# Patient Record
Sex: Male | Born: 1955
Health system: Southern US, Community
[De-identification: ages and names within clinical notes are randomized; demographics above are authoritative.]

## PROBLEM LIST (undated history)

## (undated) DIAGNOSIS — J449 Chronic obstructive pulmonary disease, unspecified: Secondary | ICD-10-CM

## (undated) DIAGNOSIS — M1990 Unspecified osteoarthritis, unspecified site: Secondary | ICD-10-CM

## (undated) DIAGNOSIS — I2510 Atherosclerotic heart disease of native coronary artery without angina pectoris: Secondary | ICD-10-CM

## (undated) HISTORY — PX: TONSILLECTOMY: SUR1361

## (undated) HISTORY — DX: Atherosclerotic heart disease of native coronary artery without angina pectoris: I25.10

## (undated) HISTORY — DX: Chronic obstructive pulmonary disease, unspecified: J44.9

## (undated) HISTORY — PX: APPENDECTOMY: SHX54

## (undated) HISTORY — PX: RECTOPERITONEAL FISTULA CLOSURE: SHX2314

## (undated) HISTORY — DX: Unspecified osteoarthritis, unspecified site: M19.90

## (undated) HISTORY — PX: KNEE ARTHROSCOPY: SUR90

## (undated) HISTORY — PX: LUMBAR SPINE SURGERY: SHX701

---

## 2011-04-21 ENCOUNTER — Ambulatory Visit: Payer: Self-pay | Admitting: Family Medicine

## 2011-08-21 ENCOUNTER — Other Ambulatory Visit: Payer: Self-pay | Admitting: Unknown Physician Specialty

## 2011-12-15 ENCOUNTER — Ambulatory Visit: Payer: Self-pay | Admitting: Unknown Physician Specialty

## 2012-02-25 ENCOUNTER — Ambulatory Visit: Payer: Self-pay | Admitting: Unknown Physician Specialty

## 2012-02-26 LAB — PATHOLOGY REPORT

## 2013-03-25 ENCOUNTER — Ambulatory Visit: Payer: Self-pay | Admitting: Unknown Physician Specialty

## 2013-04-14 ENCOUNTER — Other Ambulatory Visit: Payer: Self-pay | Admitting: Neurosurgery

## 2013-04-14 DIAGNOSIS — M549 Dorsalgia, unspecified: Secondary | ICD-10-CM

## 2013-04-22 ENCOUNTER — Ambulatory Visit
Admission: RE | Admit: 2013-04-22 | Discharge: 2013-04-22 | Disposition: A | Discharge status: Home / Self Care | Payer: BC Managed Care – PPO | Source: Ambulatory Visit | Attending: Neurosurgery | Admitting: Neurosurgery

## 2013-04-22 VITALS — BP 119/69 | HR 67

## 2013-04-22 DIAGNOSIS — M549 Dorsalgia, unspecified: Secondary | ICD-10-CM

## 2013-04-22 MED ORDER — ONDANSETRON HCL 4 MG/2ML IJ SOLN
4.0000 mg | Freq: Once | INTRAMUSCULAR | Status: AC
  Administered 2013-04-22: 4 mg via INTRAMUSCULAR

## 2013-04-22 MED ORDER — DIAZEPAM 5 MG PO TABS
10.0000 mg | ORAL_TABLET | Freq: Once | ORAL | Status: AC
  Administered 2013-04-22: 10 mg via ORAL

## 2013-04-22 MED ORDER — IOHEXOL 180 MG/ML  SOLN
20.0000 mL | Freq: Once | INTRAMUSCULAR | Status: AC | PRN
  Administered 2013-04-22: 20 mL via INTRATHECAL

## 2013-04-22 MED ORDER — MEPERIDINE HCL 100 MG/ML IJ SOLN
100.0000 mg | Freq: Once | INTRAMUSCULAR | Status: AC
  Administered 2013-04-22: 100 mg via INTRAMUSCULAR

## 2014-05-18 IMAGING — CT CT L SPINE W/ CM
4 of 11 series · 13 of 33 positions shown, 15 images · non-contrast
Comparison: 03/25/2013 MRI

CLINICAL DATA: The left leg pain.  Back pain.

CT MYELOGRAPHY LUMBAR SPINE
TECHNIQUE: CT imaging of the lumbar spine was performed after
intrathecal contrast administration.  Multiplanar CT image
reconstructions were also generated.

[Series 2: l spine bone · axial · 0.27mm/px · z∈[-314,-57]mm · 3 of 104 slices shown, 4 images]
[im 1/104  soft-tissue]
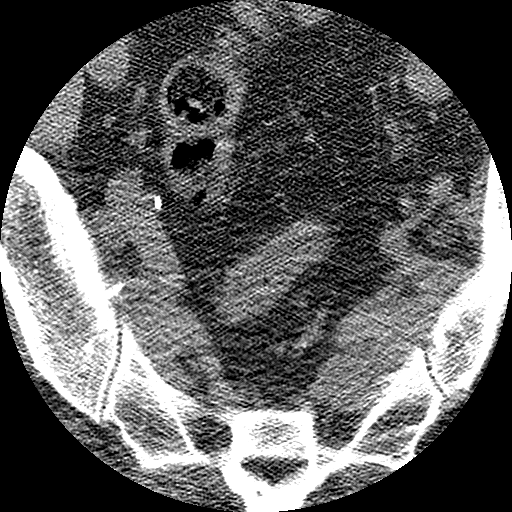
[im 1/104  bone]
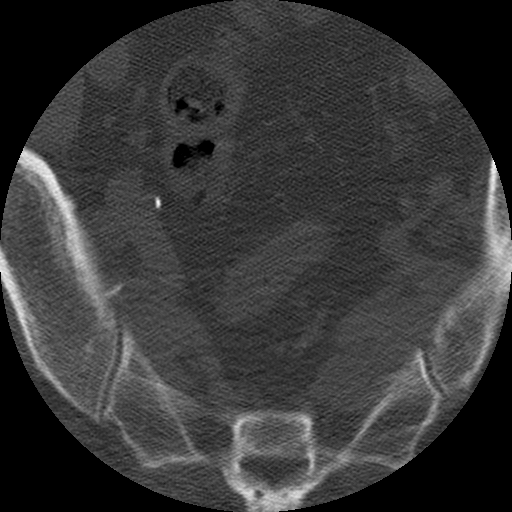
[im 52/104  bone]
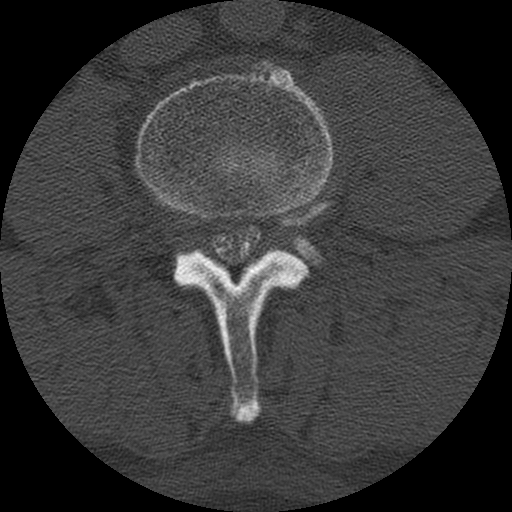
[im 104/104  bone]
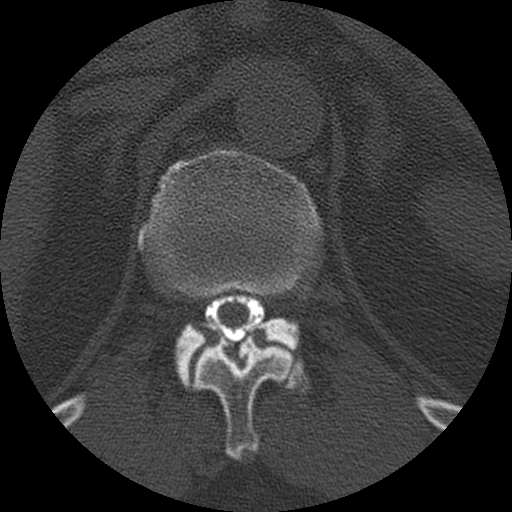

[Series 3: l spine soft · axial · 0.27mm/px · z∈[-230,-144]mm · 2 of 104 slices shown]
[im 35/104  soft-tissue]
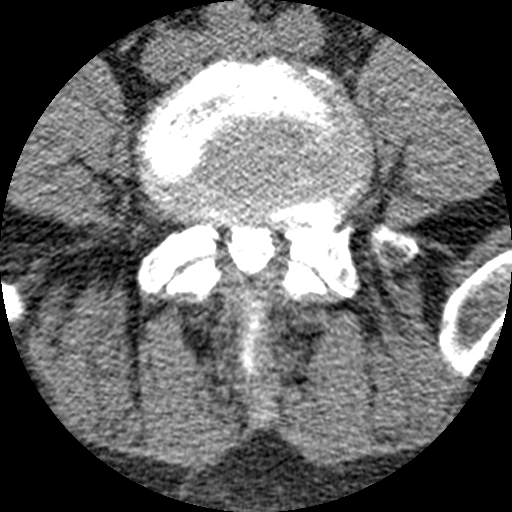
[im 69/104  soft-tissue]
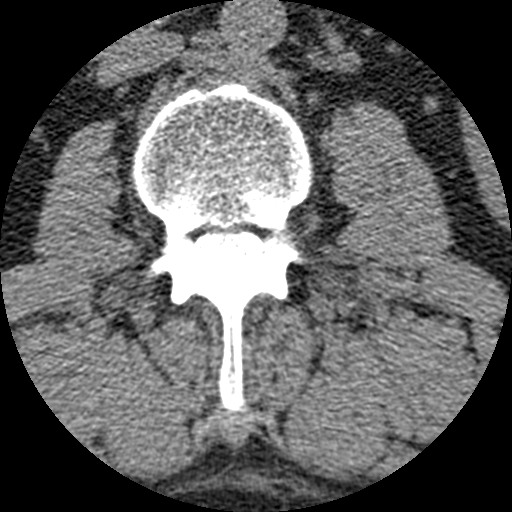

[Series 401: cor lower · coronal · 0.52mm/px · 3 of 69 slices shown]
[im 7/69  bone]
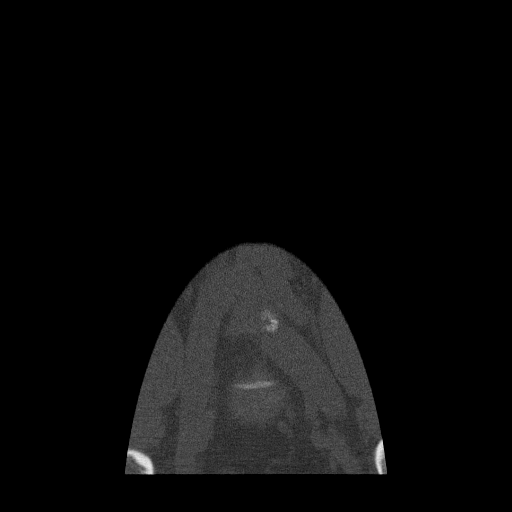
[im 35/69  bone]
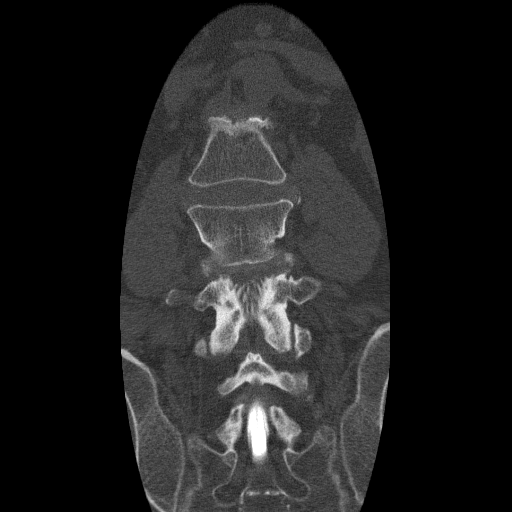
[im 63/69  bone]
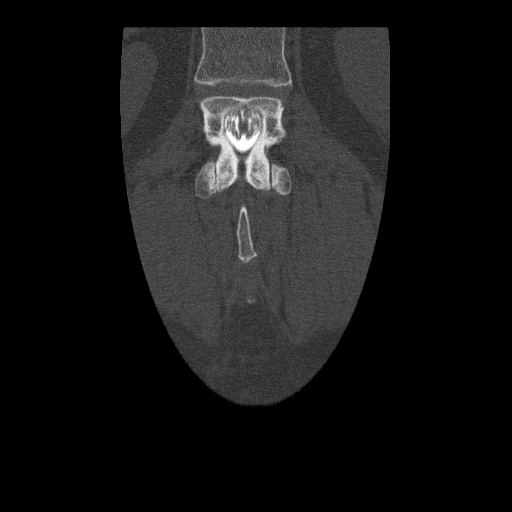

[Series 402: sag · sagittal · 0.52mm/px · 5 of 56 slices shown, 6 images]
[im 19/56  bone]
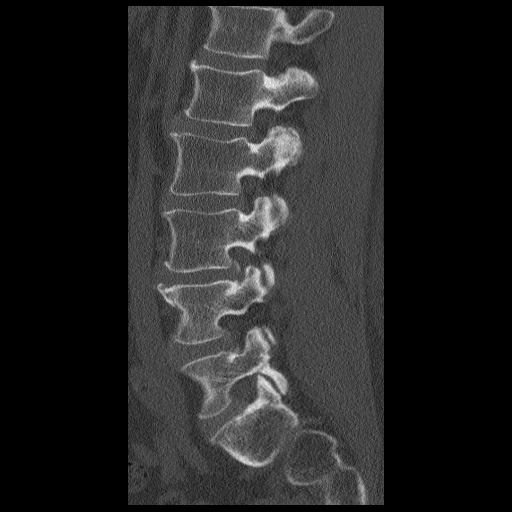
[im 23/56  bone]
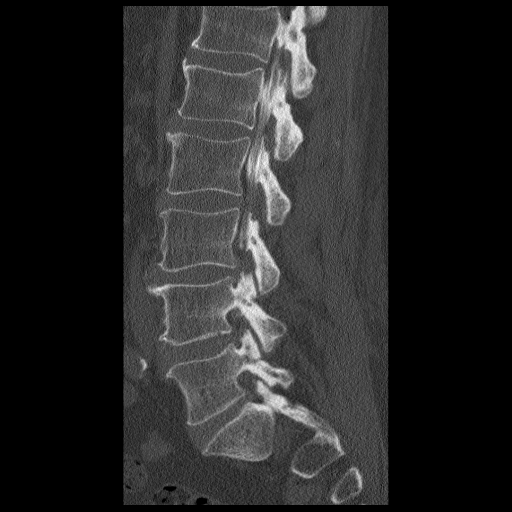
[im 28/56  soft-tissue]
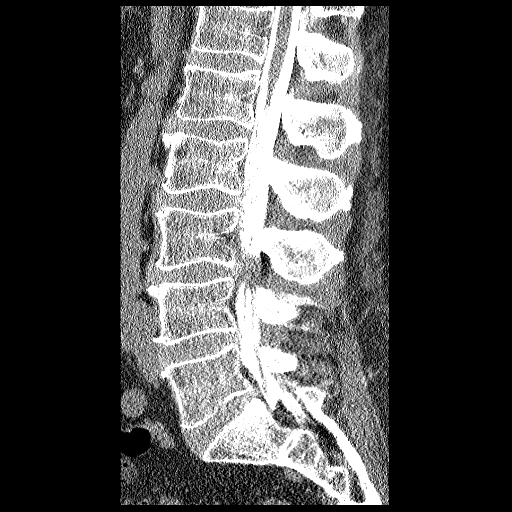
[im 28/56  bone]
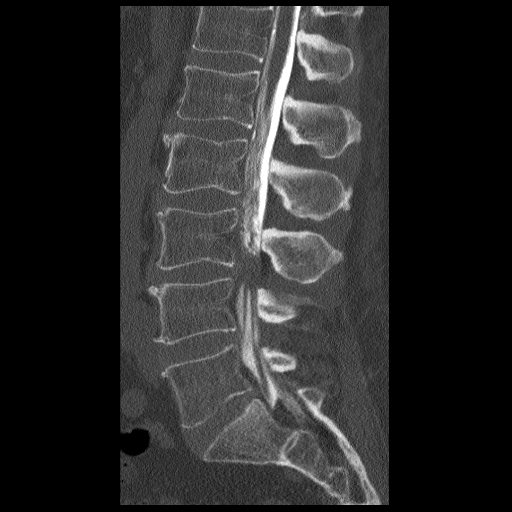
[im 33/56  bone]
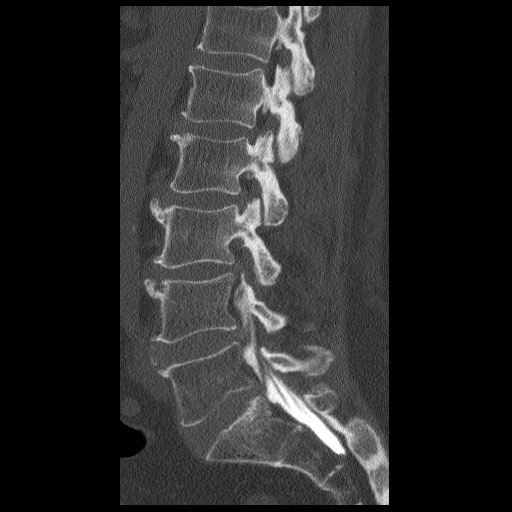
[im 37/56  bone]
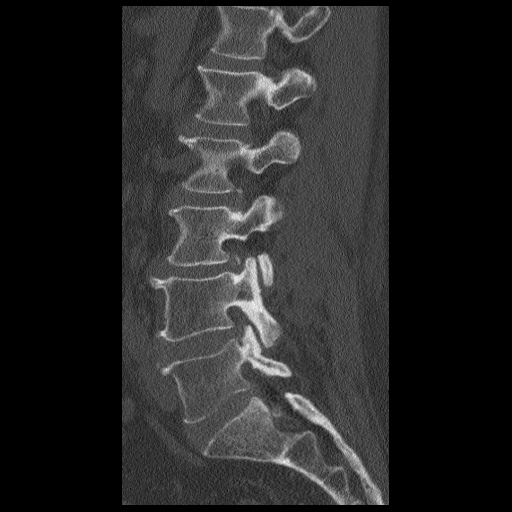

[13 of 33 positions shown; findings below may reference images not displayed]

FINDINGS: Radiologist injection.

Conus medullaris terminates at the L1-2 disc.  No vertebral
compression.  Anatomic alignment.  No pars defect.

L1-2:  Mild disc bulge laterally and anteriorly without central
canal encroachment.  Mild ligamentum flavum hypertrophy.  Patent
foramina.

L2-3:  Mild concentric bulge. Minimal left foraminal and
extraforaminal protrusion causing mild left foraminal narrowing..

L3-4:  Concentric bulge and ligamentum flavum hypertrophy result in
severe central stenosis and impingement of the nerve roots.  There
is redundancy of the nerve roots above this level with
straightening of the nerve roots below the disc.  Moderate
bilateral foraminal narrowing secondary to disc material.

L4-5:  Minimal concentric disc bulge without stenosis.  Mild
ligamentum flavum hypertrophy.  Mild bilateral foraminal narrowing
is multifactorial.

L5-S1:  Unremarkable level.
IMPRESSION: Severe multifactorial spinal stenosis at L3-4 with redundancy of
the nerve roots above the level.  There is foraminal narrowing at
this level as well.

No other significant focal area of impingement.  Mild degenerative
changes are noted.

## 2014-05-18 IMAGING — RF DG MYELOGRAM LUMBAR
14 series · 14 of 14 positions shown · IV contrast (omnipaque)
Comparison: none

CLINICAL DATA: Left lower extremity pain.  Back pain.

MYELOGRAM LUMBAR
TECHNIQUE: The procedure, risks, benefits, and alternatives were
explained to the patient. The patient understands and consents.
Under fluoroscopic guidance, a 22 gauge spinal needle was placed in
the CSF space via right L5-S1 approach. 20 mL of Omnipaque 180 was
injected.

[Series 1: (hospital) · 1 of 1 slices shown]
[im 1/1]
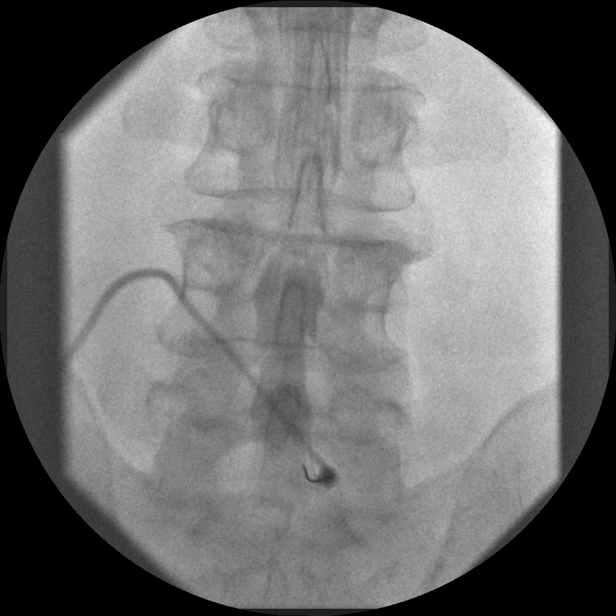

[Series 2: myelogram  white · 1 of 1 slices shown (1 of 13)]
[im 1/1]
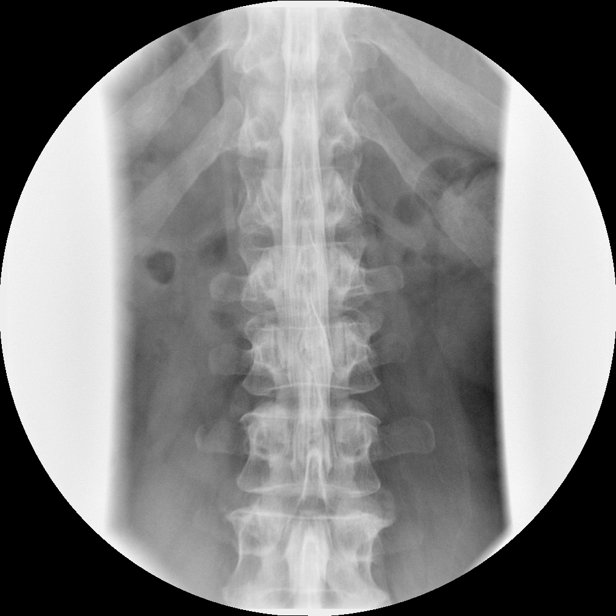

[Series 3: myelogram  white · 1 of 1 slices shown (2 of 13)]
[im 1/1]
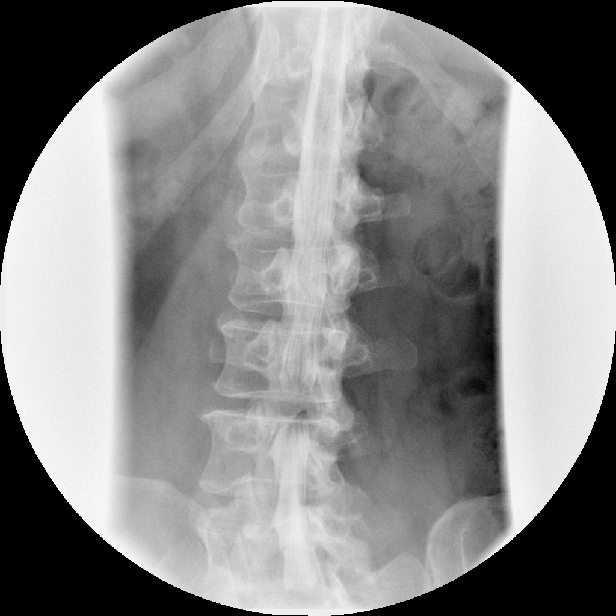

[Series 4: myelogram  white · 1 of 1 slices shown (3 of 13)]
[im 1/1]
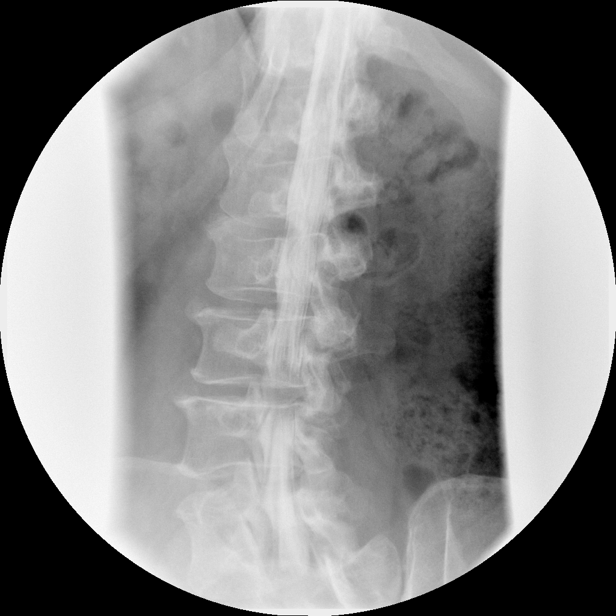

[Series 5: myelogram  white · 1 of 1 slices shown (4 of 13)]
[im 1/1]
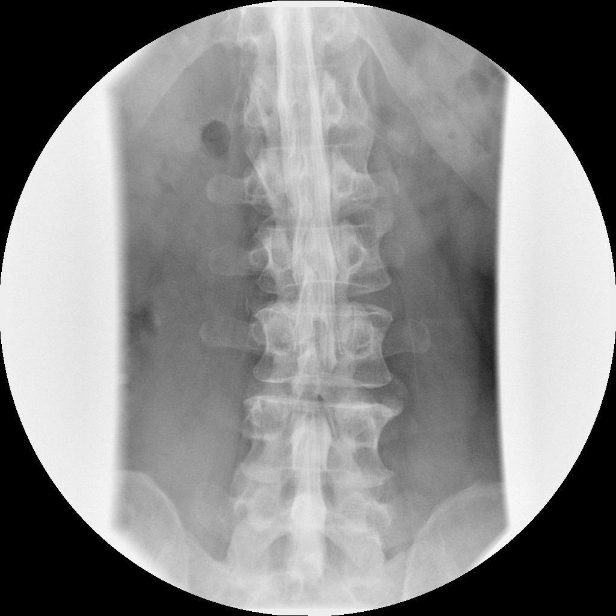

[Series 6: myelogram  white · 1 of 1 slices shown (5 of 13)]
[im 1/1]
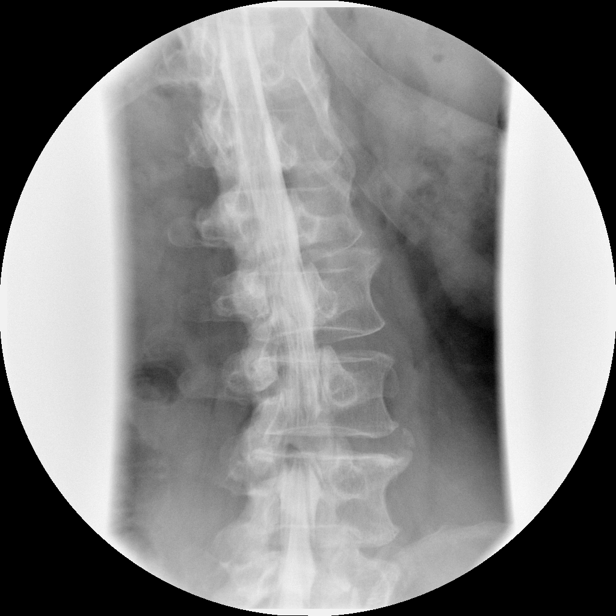

[Series 7: myelogram  white · 1 of 1 slices shown (6 of 13)]
[im 1/1]
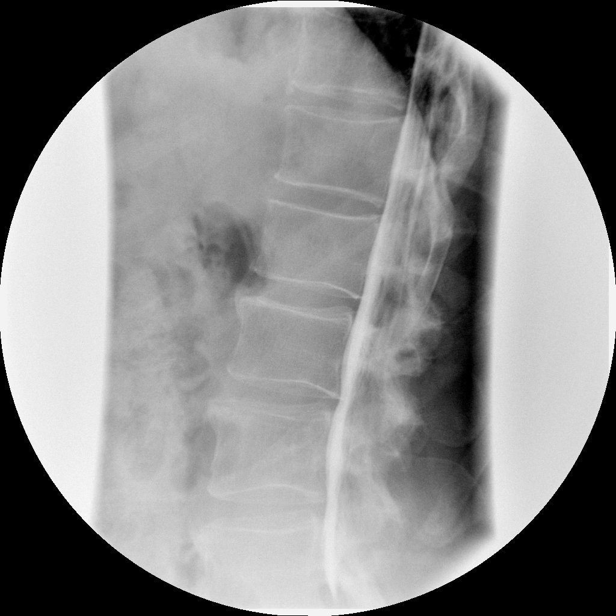

[Series 8: myelogram  white · 1 of 1 slices shown (7 of 13)]
[im 1/1]
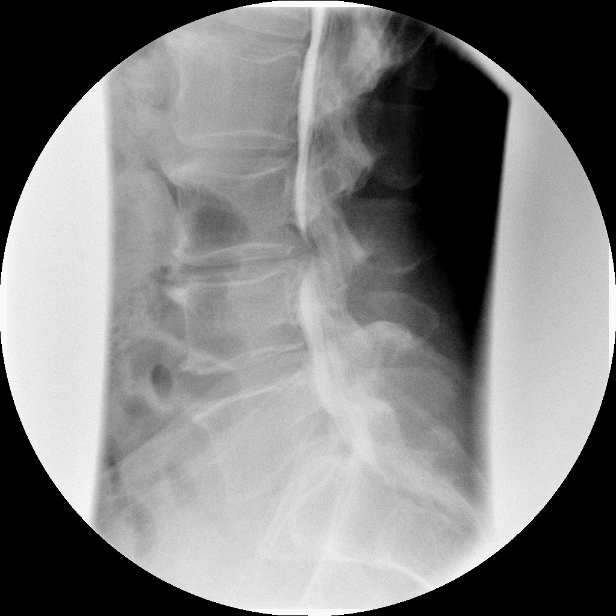

[Series 9: myelogram  white · 1 of 1 slices shown (8 of 13)]
[im 1/1]
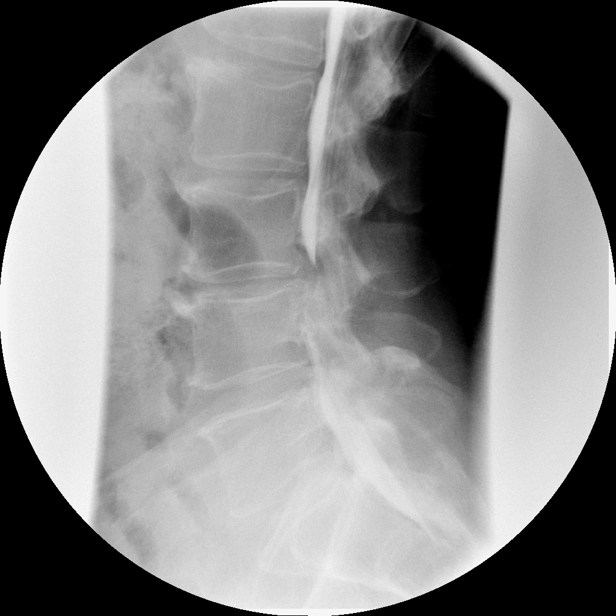

[Series 10: myelogram  white · 1 of 1 slices shown (9 of 13)]
[im 1/1]
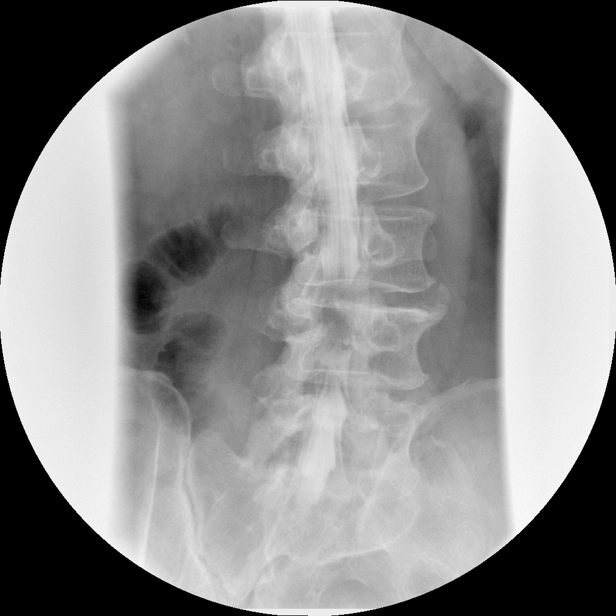

[Series 11: myelogram  white · 1 of 1 slices shown (10 of 13)]
[im 1/1]
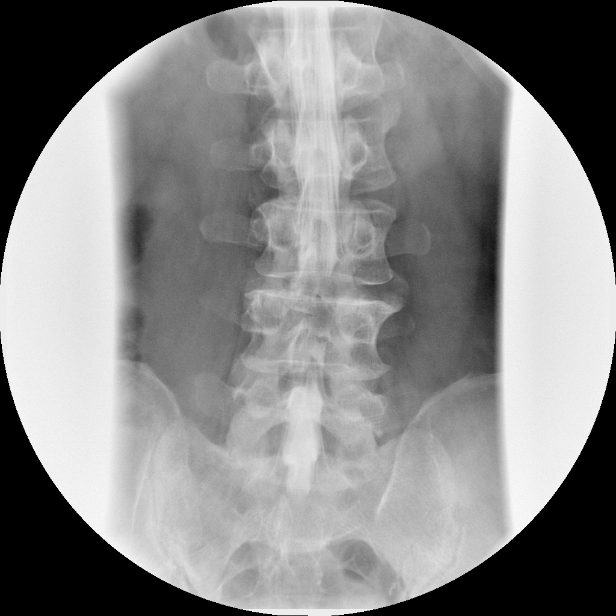

[Series 12: myelogram  white · 1 of 1 slices shown (11 of 13)]
[im 1/1]
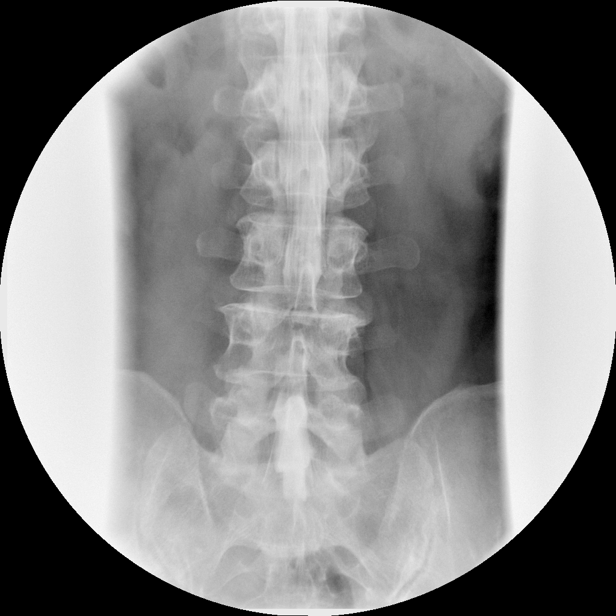

[Series 14: myelogram  white · 1 of 1 slices shown (12 of 13)]
[im 1/1]
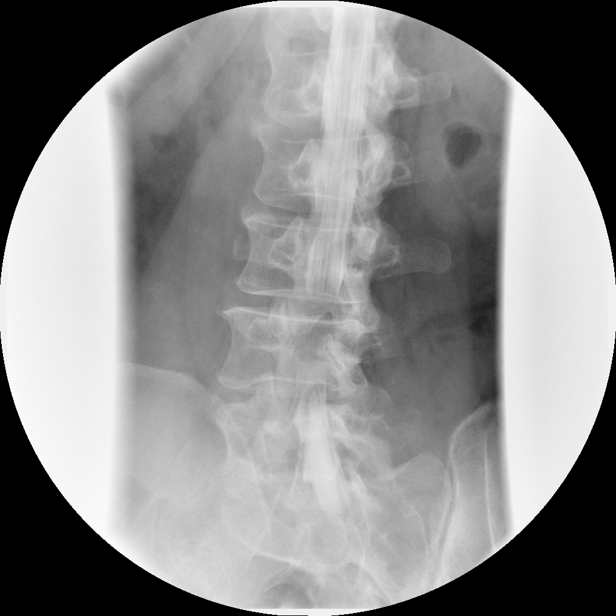

[Series 15: myelogram  white · 1 of 1 slices shown (13 of 13)]
[im 1/1]
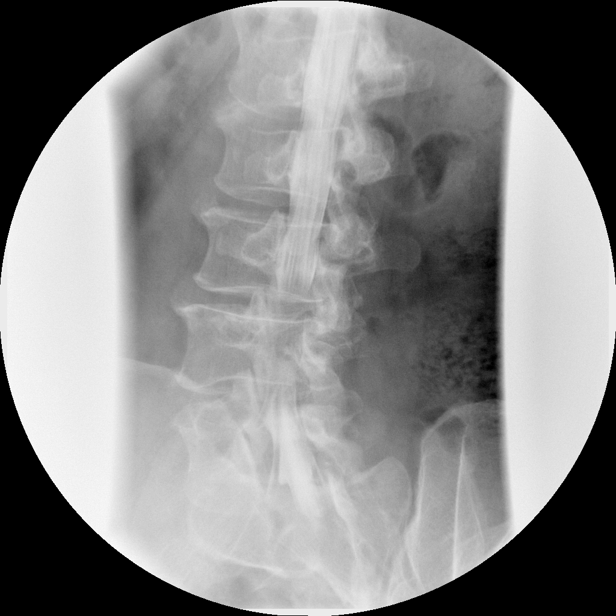

[14 of 14 positions shown; findings below may reference images not displayed]

FINDINGS: There is anatomic alignment.  No vertebral compression
deformity.

L1-2:  Mild narrowing.  No spinal stenosis

L2-3:  Mild narrowing.  No disc herniation or stenosis.

L3-4:  Prominent anterior epidural mass effect resulting in spinal
stenosis.

L4-5:  Minimal degenerative change.  No central stenosis.

L5 S1:  Unremarkable.

Complications: None.

Fluoroscopy Time: 54 seconds.
IMPRESSION: Central stenosis at L3-4 corresponding to the abnormality on the
prior MRI.

## 2014-09-01 ENCOUNTER — Ambulatory Visit: Payer: Self-pay | Admitting: Gastroenterology

## 2014-09-01 LAB — HM COLONOSCOPY

## 2014-09-05 LAB — PATHOLOGY REPORT

## 2015-03-18 NOTE — Op Note (Signed)
PATIENT NAME:  Juan Knight, Juan Knight MR#:  [id number] DATE OF BIRTH:  [birth year]  DATE OF PROCEDURE:  02/25/2012  PREOPERATIVE DIAGNOSIS: Possible chondral lesion, right knee.   POSTOPERATIVE DIAGNOSIS: Multiple chondral lesions, right knee, along with reactive synovitis.   OPERATION: Arthroscopic chondral debridement, right knee, plus a limited synovectomy.   SURGEON: Alda BertholdHarold B. Maurio Baize, Jr., MD   ANESTHESIA: General.   HISTORY: The patient had a long history of right knee pain. Plain films had revealed very minimal narrowing of his medial compartment. MRI did not reveal any significant meniscal or chondral pathology.   The patient was ultimately brought in for arthroscopy of his right knee due to his persistent symptoms despite conservative treatment.   PROCEDURE: The patient was taken to the operating room where satisfactory general anesthesia was achieved. A tourniquet and legholder were applied to the right thigh and the left lower extremity was supported with a well leg support system.   The right knee was prepped and draped in the usual fashion for an arthroscopic procedure. Inflow cannula was introduced superomedially. A small amount of synovial fluid was obtained. The joint was distended with lactated Ringer's. We used the Mitek fluid pump to facilitate joint distention.   The scope was introduced through an inferolateral puncture wound and a probe through an inferomedial puncture wound. Inspection of the medial compartment failed to reveal any meniscal tear. I thought the medial compartment chondral surfaces were smooth. Inspection of the intercondylar notch revealed moderate proliferative synovitis. The cruciates appeared to be intact.   Inspection of the lateral compartment failed to reveal any obvious meniscal tear. The patient had a very small grade III chondral lesion in the midportion of the tibial plateau. The femoral articular surface appeared smooth.    I did go ahead and debride  the small tibial plateau chondral lesion with a motorized resector. The lesion was microfractured with a 45 degree awl.   The scope was introduced in the intercondylar notch and the posterior recess of the medial and lateral compartments. No additional pathology. The trochlear groove was inspected. It was smooth in the inferior aspect of the groove. More proximally or superiorly there was some grade III chondral lesions that I debrided with a Turbo whisker. Inspection of the retropatellar surface revealed the patient had grade III chondral changes throughout the retropatellar surface. These were debrided with a Turbo whisker. I then used an [employer] wand to coblate the retropatellar chondral lesions. Patellar tracking was observed from the superomedial portal and indeed the patella seemed to track normally.   I then performed a limited synovectomy in the suprapatellar area. The patient did have some thickened villous type synovium that was resected with a 4 mm synovial resector. I also used a pituitary rongeur to obtain a specimen for pathology to analyze.   Incidentally, the tourniquet was inflated during the course of the synovectomy. It was deflated at the conclusion of the procedure. It was only up about 15 to 20 minutes.   At the conclusion of the procedure, I did decompress the fluid in the joint through the cannulas. The cannulas were removed. The puncture wounds were closed with 3-0 nylon in vertical mattress fashion. Several mL of 0.5% Marcaine without epinephrine were injected about each puncture wound and Betadine was applied to the wounds followed by a sterile dressing and ice pack.   The patient was then awakened and transferred to his stretcher bed.   He was taken to the recovery room in satisfactory condition.  Blood loss was negligible.   ____________________________ Alda Berthold., MD hbk:drc D: 02/25/2012 23:26:30 ET T: 02/26/2012 09:42:38  ET JOB#: [id number]  cc: Alda Berthold., MD, <Dictator> Randon Goldsmith, Montez Hageman MD ELECTRONICALLY SIGNED 03/14/2012 21:31

## 2016-04-30 LAB — HEPATIC FUNCTION PANEL: AST: 13 U/L — AB (ref 14–40)

## 2016-05-02 DIAGNOSIS — E669 Obesity, unspecified: Secondary | ICD-10-CM | POA: Diagnosis not present

## 2016-05-02 DIAGNOSIS — F419 Anxiety disorder, unspecified: Secondary | ICD-10-CM | POA: Diagnosis not present

## 2016-05-02 DIAGNOSIS — J449 Chronic obstructive pulmonary disease, unspecified: Secondary | ICD-10-CM | POA: Diagnosis not present

## 2016-05-02 DIAGNOSIS — Z0000 Encounter for general adult medical examination without abnormal findings: Secondary | ICD-10-CM | POA: Diagnosis not present

## 2016-05-02 DIAGNOSIS — M545 Low back pain: Secondary | ICD-10-CM | POA: Diagnosis not present

## 2016-05-02 DIAGNOSIS — E559 Vitamin D deficiency, unspecified: Secondary | ICD-10-CM | POA: Diagnosis not present

## 2016-05-02 LAB — HEPATIC FUNCTION PANEL: ALT: 15 U/L (ref 10–40)

## 2016-05-02 LAB — LIPID PANEL
Cholesterol: 123 mg/dL (ref 0–200)
HDL: 55 mg/dL (ref 35–70)
LDL Cholesterol: 62 mg/dL
Triglycerides: 31 mg/dL — AB (ref 40–160)

## 2016-05-02 LAB — PSA: PSA: 0.4

## 2016-05-02 LAB — VITAMIN D 25 HYDROXY (VIT D DEFICIENCY, FRACTURES): VIT D 25 HYDROXY: 66

## 2016-05-02 LAB — CBC AND DIFFERENTIAL: Hemoglobin: 13.8 g/dL (ref 13.5–17.5)

## 2016-05-02 LAB — TSH: TSH: 1.2 u[IU]/mL (ref 0.41–5.90)

## 2016-05-02 LAB — BASIC METABOLIC PANEL
Creatinine: 0.8 mg/dL (ref <=1.3)
GLUCOSE: 87 mg/dL

## 2016-06-12 DIAGNOSIS — S339XXA Sprain of unspecified parts of lumbar spine and pelvis, initial encounter: Secondary | ICD-10-CM | POA: Diagnosis not present

## 2016-06-12 DIAGNOSIS — R030 Elevated blood-pressure reading, without diagnosis of hypertension: Secondary | ICD-10-CM | POA: Diagnosis not present

## 2017-02-10 ENCOUNTER — Encounter (INDEPENDENT_AMBULATORY_CARE_PROVIDER_SITE_OTHER): Payer: Self-pay

## 2017-02-10 ENCOUNTER — Ambulatory Visit (INDEPENDENT_AMBULATORY_CARE_PROVIDER_SITE_OTHER): Payer: BLUE CROSS/BLUE SHIELD | Admitting: Family Medicine

## 2017-02-10 ENCOUNTER — Encounter: Payer: Self-pay | Admitting: Family Medicine

## 2017-02-10 VITALS — BP 118/74 | HR 64 | Temp 98.6°F | Ht 71.0 in | Wt 225.8 lb

## 2017-02-10 DIAGNOSIS — Z0000 Encounter for general adult medical examination without abnormal findings: Secondary | ICD-10-CM

## 2017-02-10 DIAGNOSIS — Z7189 Other specified counseling: Secondary | ICD-10-CM

## 2017-02-10 DIAGNOSIS — J449 Chronic obstructive pulmonary disease, unspecified: Secondary | ICD-10-CM

## 2017-02-10 NOTE — Progress Notes (Signed)
Pre visit review using our clinic review tool, if applicable. No additional management support is needed unless otherwise documented below in the visit note. 

## 2017-02-10 NOTE — Patient Instructions (Signed)
Update me as needed.  I'll get your records in the meantime.  Take care.  Glad to see you.

## 2017-02-13 ENCOUNTER — Encounter: Payer: Self-pay | Admitting: Family Medicine

## 2017-02-13 DIAGNOSIS — J449 Chronic obstructive pulmonary disease, unspecified: Secondary | ICD-10-CM | POA: Insufficient documentation

## 2017-02-13 DIAGNOSIS — Z0000 Encounter for general adult medical examination without abnormal findings: Secondary | ICD-10-CM | POA: Insufficient documentation

## 2017-02-13 DIAGNOSIS — Z7189 Other specified counseling: Secondary | ICD-10-CM | POA: Insufficient documentation

## 2017-02-13 NOTE — Assessment & Plan Note (Signed)
Likely. Discussed with patient. Continue inhaler for now. Requesting old records. We'll go from there. He agrees. Lungs clear today.

## 2017-02-13 NOTE — Progress Notes (Signed)
CPE- See plan.  Routine anticipatory guidance given to patient.  See health maintenance.  The possibility exists that previously documented standard health maintenance information may have been brought forward from a previous encounter into this note.  If needed, that same information has been updated to reflect the current situation based on today's encounter.    Tetanus d/w pt.  Will check old records.  PNA and shingles shot not due Flu done fall 2017 Colonoscopy up to date per outside clinic, will await old records.  Prostate cancer screening and PSA options (with potential risks and benefits of testing vs not testing) were discussed along with recent recs/guidelines.  He declined testing PSA at this point.  Will check old records and defer for now.   Diet and exercise discussed with patient. Will check old records regarding lipids and sugar. HIV screening previously done per patient report. Will check old records regarding hepatitis C screening. Screening for HIV and hepatitis C discussed with patient. Living will discussed with patient. If patient were incapacitated then would have wife designated.  Possible COPD. Previous smoker., 40 year history. No cough, no wheeze. Compliant with current medications. He has noted that if he skips a day of medication he will not have symptoms but he skips several days in a row he will notice some respiratory symptoms. Will check old records. Requesting old records.  PMH and SH reviewed  Meds, vitals, and allergies reviewed.   ROS: Per HPI.  Unless specifically indicated otherwise in HPI, the patient denies:  General: fever. Eyes: acute vision changes ENT: sore throat Cardiovascular: chest pain Respiratory: SOB GI: vomiting GU: dysuria Musculoskeletal: acute back pain Derm: acute rash Neuro: acute motor dysfunction Psych: worsening mood Endocrine: polydipsia Heme: bleeding Allergy: hayfever  GEN: nad, alert and oriented HEENT: mucous  membranes moist NECK: supple w/o LA CV: rrr. PULM: ctab, no inc wob ABD: soft, +bs EXT: no edema SKIN: no acute rash

## 2017-02-13 NOTE — Assessment & Plan Note (Signed)
Living will discussed with patient. If patient were incapacitated then would have wife designated. 

## 2017-02-13 NOTE — Assessment & Plan Note (Signed)
Tetanus d/w pt.  Will check old records.  PNA and shingles shot not due Flu done fall 2017 Colonoscopy up to date per outside clinic, will await old records.  Prostate cancer screening and PSA options (with potential risks and benefits of testing vs not testing) were discussed along with recent recs/guidelines.  He declined testing PSA at this point.  Will check old records and defer for now.   Diet and exercise discussed with patient. Will check old records regarding lipids and sugar. HIV screening previously done per patient report. Will check old records regarding hepatitis C screening. Screening for HIV and hepatitis C discussed with patient. Living will discussed with patient. If patient were incapacitated then would have wife designated.

## 2017-02-22 ENCOUNTER — Telehealth: Payer: Self-pay | Admitting: Family Medicine

## 2017-02-22 DIAGNOSIS — Z125 Encounter for screening for malignant neoplasm of prostate: Secondary | ICD-10-CM

## 2017-02-22 NOTE — Telephone Encounter (Addendum)
Call pt.  Old records reviewed.  Prev had HCV testing done, neg, listed in records.  I don't see HIV test results, but he may have had that done elsewhere.  Colonoscopy up to date.  Okay until 2020, done 2015 with 5 year f/u rec.  PSA prev neg but not done in the last year.  Likely okay to skip a year with PSA this year, consider rechecking next year.  If he is worried about this, we can recheck sooner rather than later.  Didn't see tetanus dose in records.  Lung disease listed at COPD in records and this makes sense given his hx.  Okay to continue inhaler as is.  If he isn't tolerating this, we can consider changes/alternative med.    Bottom line, likely due for Tdap.  Let me know if he wants to get HIV/PSA done.  Update me as needed re: breathing concerns.   Thanks.

## 2017-02-23 NOTE — Telephone Encounter (Signed)
Patient returned Regina's call.  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

## 2017-02-23 NOTE — Telephone Encounter (Signed)
Patient notified as instructed by telephone and verbalized understanding. Patient stated that he remembers his previous doctor doing the HIV test and he told him everything was okay. Patient stated that he is okay to wait on the PSA until next year. Patient stated that he is okay with repeating the HIV and doing whatever lab work Dr. Para March feels that he needs now. Patient stated that he does not remember when he had his last tetanus and will get that done whenever Dr. Para March feels that he should.

## 2017-02-24 ENCOUNTER — Encounter: Payer: Self-pay | Admitting: Family Medicine

## 2017-02-25 NOTE — Telephone Encounter (Signed)
HIV udpated in EMR.  Would get Tdap here at RN visit or at pharmacy, whichever is cheaper for the patient.  Put in order for PSA next year at CPE.  Thanks.

## 2017-02-25 NOTE — Telephone Encounter (Signed)
Left message on voicemail for patient to call back. 

## 2017-02-27 ENCOUNTER — Encounter: Payer: Self-pay | Admitting: Family Medicine

## 2017-02-27 NOTE — Telephone Encounter (Signed)
Patient returned your call Best number [phone]

## 2017-03-02 NOTE — Telephone Encounter (Signed)
Patient notified as instructed by telephone and verbalized understanding. Patient stated that he will call his insurance company and will plan on getting the Tdap at the office or the pharmacy depending on what the insurance company tells him. Patient stated that he will call back and schedule a nurse visit if the insurance company will pay for the vaccine here.

## 2017-05-07 ENCOUNTER — Encounter: Payer: Self-pay | Admitting: Internal Medicine

## 2017-05-07 ENCOUNTER — Ambulatory Visit (INDEPENDENT_AMBULATORY_CARE_PROVIDER_SITE_OTHER): Payer: BLUE CROSS/BLUE SHIELD | Admitting: Internal Medicine

## 2017-05-07 VITALS — BP 120/76 | HR 72 | Temp 98.0°F | Wt 235.8 lb

## 2017-05-07 DIAGNOSIS — M545 Low back pain, unspecified: Secondary | ICD-10-CM

## 2017-05-07 MED ORDER — METHOCARBAMOL 500 MG PO TABS: 500.0000 mg | ORAL_TABLET | Freq: Two times a day (BID) | ORAL | 0 refills | Status: DC | PRN

## 2017-05-07 MED ORDER — NAPROXEN 500 MG PO TABS: 500.0000 mg | ORAL_TABLET | Freq: Two times a day (BID) | ORAL | 0 refills | Status: DC

## 2017-05-07 NOTE — Patient Instructions (Signed)

## 2017-05-07 NOTE — Progress Notes (Signed)
Subjective:    Patient ID: Juan PolingMichael R Knight, male    DOB: 04/04/1956, 61 y.o.   MRN: [id number]  HPI  Pt presents to the clinic today with c/o low back pain. He reports this started 4 days ago. He describes the pain as. The pain radiates into the left hip. He denies numbness or tingling in his left leg. He denies issues with his bowel or bladder. He denies any injury to the area. He has tried Aspirin Back and Body and heat, but he thinks that made is worse.  Review of Systems      Past Medical History:  Diagnosis Date  . COPD (chronic obstructive pulmonary disease) (HCC)   . OA (osteoarthritis)     Current Outpatient Prescriptions  Medication Sig Dispense Refill  . Cholecalciferol (VITAMIN D3) 5000 units CAPS Take by mouth daily.    . Glycopyrrolate-Formoterol (BEVESPI AEROSPHERE) 9-4.8 MCG/ACT AERO Inhale 2 puffs into the lungs daily as needed.    Marland Kitchen. ibuprofen (ADVIL,MOTRIN) 200 MG tablet Take 200 mg by mouth as needed.     No current facility-administered medications for this visit.     No Known Allergies  Family History  Problem Relation Age of Onset  . Leukemia Father   . Colon cancer Neg Hx   . Prostate cancer Neg Hx     Social History   Social History  . Marital status: Married    Spouse name: N/A  . Number of children: N/A  . Years of education: N/A   Occupational History  . Not on file.   Social History Main Topics  . Smoking status: Former Smoker    Packs/day: 1.00    Years: 40.00    Quit date: 11/24/2010  . Smokeless tobacco: Never Used  . Alcohol use Yes     Comment: Six pack per week  . Drug use: No  . Sexual activity: Not on file   Other Topics Concern  . Not on file   Social History Narrative   Married 2007.   1 daughter. One stepson. One stepdaughter.   [place].   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 Home at night.   [employer] fan. [employer] fan.     Constitutional: Denies fever, malaise, fatigue, headache or  abrupt weight changes.  Gastrointestinal: Denies abdominal pain, bloating, constipation, diarrhea or blood in the stool.  GU: Denies urgency, frequency, pain with urination, burning sensation, blood in urine, odor or discharge. Musculoskeletal: Pt reports back pain. Denies decrease in range of motion, difficulty with gait, or joint swelling.    No other specific complaints in a complete review of systems (except as listed in HPI above).  Objective:   Physical Exam    BP 120/76   Pulse 72   Temp 98 F (36.7 C) (Oral)   Wt 235 lb 12 oz (106.9 kg)   SpO2 96%   BMI 32.88 kg/m  Wt Readings from Last 3 Encounters:  05/07/17 235 lb 12 oz (106.9 kg)  02/10/17 225 lb 12 oz (102.4 kg)    General: Appears  his stated age, obese in NAD. Musculoskeletal: Decreased flexion, extension and rotation due to pain. Normal lateral bending. No bony tenderness noted over the spine. Pain with palpation of the left paralumbar muscles. Strength 4/5 LLE, 5/5 RLE.  BMET    Component Value Date/Time   CREATININE 0.8 05/02/2016    Lipid Panel     Component Value Date/Time   CHOL 123 05/02/2016  TRIG 31 (A) 05/02/2016   HDL 55 05/02/2016   LDLCALC 62 05/02/2016    CBC    Component Value Date/Time   HGB 13.8 05/02/2016    Hgb A1C No results found for: HGBA1C        Assessment & Plan:   Low Back Pain:  Likely muscular strain eRx for Naproxen BID prn eRx for Robaxin 500 mg Q12H prn Stretching exercises given  Return precautions discussed [name], NP

## 2017-08-06 ENCOUNTER — Telehealth: Payer: Self-pay | Admitting: Family Medicine

## 2017-08-06 DIAGNOSIS — Z131 Encounter for screening for diabetes mellitus: Secondary | ICD-10-CM

## 2017-08-06 NOTE — Telephone Encounter (Signed)
Caller Name:mike Lupa  Relationship to Patient:self  Best number: [phone]  Pharmacy:  Reason for call: pt needs orders for labs for routine labs for work  Please call when orders are in.

## 2017-08-09 ENCOUNTER — Encounter: Payer: Self-pay | Admitting: Family Medicine

## 2017-08-09 NOTE — Telephone Encounter (Signed)
All he needs is glucose (and PSA if desired).  No need to repeat lipids, etc.  Needs flu and tdap.  Orders are in for labs.  Thanks.

## 2017-08-10 ENCOUNTER — Other Ambulatory Visit (INDEPENDENT_AMBULATORY_CARE_PROVIDER_SITE_OTHER): Payer: BLUE CROSS/BLUE SHIELD

## 2017-08-10 DIAGNOSIS — Z131 Encounter for screening for diabetes mellitus: Secondary | ICD-10-CM | POA: Diagnosis not present

## 2017-08-10 DIAGNOSIS — Z125 Encounter for screening for malignant neoplasm of prostate: Secondary | ICD-10-CM | POA: Diagnosis not present

## 2017-08-10 NOTE — Telephone Encounter (Signed)
Patient advised.  Lab appt scheduled.   Patient does want PSA.  Patient will bring form in when he comes for labs.

## 2017-08-11 ENCOUNTER — Other Ambulatory Visit: Payer: BLUE CROSS/BLUE SHIELD

## 2017-08-11 ENCOUNTER — Telehealth: Payer: Self-pay | Admitting: Family Medicine

## 2017-08-11 LAB — PSA: PSA: 0.54 ng/mL (ref 0.10–4.00)

## 2017-08-11 LAB — GLUCOSE, RANDOM: GLUCOSE: 99 mg/dL (ref 70–99)

## 2017-08-11 NOTE — Telephone Encounter (Signed)
Placed in Dr. Duncan's In Box. 

## 2017-08-11 NOTE — Telephone Encounter (Signed)
Caller Name:Adair Hunt  Relationship to Patient:self   Best number:[phone] Pharmacy:  Reason for call: pt dropped off form for biometric screening . Please fax to number on bottom of sheet. Please call pt to let him know it is being faxed. Placing in rx tower  Thanks

## 2017-08-11 NOTE — Telephone Encounter (Signed)
Patient came in for labs this morning. Hopefully, that will cover the needs of the form.

## 2017-08-11 NOTE — Telephone Encounter (Signed)
If he needs other labs specifically for work then let me know.  Thanks.

## 2017-08-12 NOTE — Telephone Encounter (Signed)
Notify patient. His sugar and PSA are normal.  i thought he had a routine physical coming up. I ordered all of the tests that were medically indicated.  I did not get his form for work before his labs were collected. The form is asking for multiple tests that are not medically indicated and would not ever be collected unless his work demanded them. I did not order them on the previous labs since I hadn't seen the form. It is also asking for a body fat percentage. We have no way of calculating that in the clinic.  He is going to end up needing an office visit to recheck his height weight blood pressure, etc. We can do other labs at that visit if he comes in fasting. Thanks.

## 2017-08-12 NOTE — Telephone Encounter (Signed)
Patient notified as instructed by telephone and verbalized understanding. Appointments schedule for Monday to complete form.

## 2017-08-17 ENCOUNTER — Encounter: Payer: Self-pay | Admitting: Family Medicine

## 2017-08-17 ENCOUNTER — Ambulatory Visit (INDEPENDENT_AMBULATORY_CARE_PROVIDER_SITE_OTHER): Payer: BLUE CROSS/BLUE SHIELD | Admitting: Family Medicine

## 2017-08-17 VITALS — BP 122/70 | HR 63 | Temp 98.4°F | Ht 70.75 in | Wt 241.2 lb

## 2017-08-17 DIAGNOSIS — Z029 Encounter for administrative examinations, unspecified: Secondary | ICD-10-CM

## 2017-08-17 DIAGNOSIS — Z823 Family history of stroke: Secondary | ICD-10-CM | POA: Diagnosis not present

## 2017-08-17 DIAGNOSIS — Z8249 Family history of ischemic heart disease and other diseases of the circulatory system: Secondary | ICD-10-CM

## 2017-08-17 DIAGNOSIS — J449 Chronic obstructive pulmonary disease, unspecified: Secondary | ICD-10-CM

## 2017-08-17 DIAGNOSIS — Z23 Encounter for immunization: Secondary | ICD-10-CM | POA: Diagnosis not present

## 2017-08-17 LAB — LIPID PANEL
CHOL/HDL RATIO: 4
CHOLESTEROL: 161 mg/dL (ref 0–200)
HDL: 44.1 mg/dL (ref >39.00)
LDL Cholesterol: 101 mg/dL — ABNORMAL HIGH (ref 0–99)
NONHDL: 117.06
TRIGLYCERIDES: 79 mg/dL (ref 0.0–149.0)
VLDL: 15.8 mg/dL (ref 0.0–40.0)

## 2017-08-17 LAB — HEMOGLOBIN A1C: Hgb A1c MFr Bld: 5.4 % (ref 4.6–6.5)

## 2017-08-17 NOTE — Patient Instructions (Addendum)
Price check the inhaler at the pharmacy and see if you need to change to a cheaper med.  Let me know if needed.   Go to the lab on the way out.  We'll contact you with your lab report. Flu and tetanus shot today.   Take care.  Glad to see you.

## 2017-08-17 NOTE — Progress Notes (Signed)
No cough, SOB, rare wheeze. Compliant with inhaler.    He takes prn aleve, d/w pt about taking it with food for back pain.    Due for flu and tdap.  D/w pt.   Due for labs for work.  D/w pt.   Grandparent with h/o CVA.  D/w pt.  Lipids pending.   He needed forms filled out for work.  See scanned copy.    Meds, vitals, and allergies reviewed.   ROS: Per HPI unless specifically indicated in ROS section   GEN: nad, alert and oriented HEENT: mucous membranes moist NECK: supple w/o LA CV: rrr. PULM: ctab, no inc wob, no wheeze.  ABD: soft, +bs EXT: no edema

## 2017-08-21 ENCOUNTER — Telehealth: Payer: Self-pay | Admitting: Family Medicine

## 2017-08-21 DIAGNOSIS — Z029 Encounter for administrative examinations, unspecified: Secondary | ICD-10-CM | POA: Insufficient documentation

## 2017-08-21 NOTE — Telephone Encounter (Signed)
Patient called to get his lab results.  Patient said a form was suppose to be filled out for his employer and he wants to know if that was done. Patient had to have it done this week.

## 2017-08-21 NOTE — Assessment & Plan Note (Signed)
Advised patient to price check his inhaler at the pharmacy and see if a change to a cheaper med is possible.  He'll let me know if needed.

## 2017-08-21 NOTE — Telephone Encounter (Signed)
Tell him I've been out of work in the meantime and I'll have it faxed out Monday AM.   I apologize for the delay but I haven't been at work in the office since this past Monday.   Thanks.

## 2017-08-21 NOTE — Assessment & Plan Note (Addendum)
See notes on labs and scanned forms.  >15 minutes spent in face to face time with patient, >50% spent in counselling or coordination of care.    Also, routine vaccination done today.  D/w pt.

## 2017-08-21 NOTE — Telephone Encounter (Signed)
Do you have this form?

## 2017-08-21 NOTE — Telephone Encounter (Signed)
Patient advised.

## 2017-08-22 NOTE — Telephone Encounter (Signed)
Labs and form done.  Please send in.  See result note. Thanks.

## 2017-08-24 NOTE — Telephone Encounter (Signed)
Faxed and scanned

## 2017-12-24 DIAGNOSIS — H5210 Myopia, unspecified eye: Secondary | ICD-10-CM | POA: Diagnosis not present

## 2017-12-24 DIAGNOSIS — H524 Presbyopia: Secondary | ICD-10-CM | POA: Diagnosis not present

## 2018-01-11 ENCOUNTER — Ambulatory Visit (INDEPENDENT_AMBULATORY_CARE_PROVIDER_SITE_OTHER)
Admission: RE | Admit: 2018-01-11 | Discharge: 2018-01-11 | Disposition: A | Discharge status: Home / Self Care | Payer: BLUE CROSS/BLUE SHIELD | Source: Ambulatory Visit | Attending: Family Medicine | Admitting: Family Medicine

## 2018-01-11 ENCOUNTER — Encounter: Payer: Self-pay | Admitting: Family Medicine

## 2018-01-11 ENCOUNTER — Ambulatory Visit: Payer: BLUE CROSS/BLUE SHIELD | Admitting: Family Medicine

## 2018-01-11 VITALS — BP 126/72 | HR 80 | Temp 99.5°F | Wt 251.5 lb

## 2018-01-11 DIAGNOSIS — R05 Cough: Secondary | ICD-10-CM

## 2018-01-11 DIAGNOSIS — R059 Cough, unspecified: Secondary | ICD-10-CM

## 2018-01-11 LAB — POC INFLUENZA A&B (BINAX/QUICKVUE)
INFLUENZA A, POC: NEGATIVE
INFLUENZA B, POC: NEGATIVE

## 2018-01-11 MED ORDER — ALBUTEROL SULFATE HFA 108 (90 BASE) MCG/ACT IN AERS: 1.0000 | INHALATION_SPRAY | Freq: Four times a day (QID) | RESPIRATORY_TRACT | 1 refills | Status: DC | PRN

## 2018-01-11 NOTE — Progress Notes (Signed)
Sx started about 5 days ago, then clearly worse about 3-4 days ago.  Coughing, chest wall is sore from cough.  Tried otc cold meds, no help.  Fevers off and on.  Sweats at night.  Some wheeze.  No vomiting. No diarrhea.  Some nausea.  Some ear pain.  No facial plain now but some occ frontal pain prev.  Diffuse aches.   Meds, vitals, and allergies reviewed.   ROS: Per HPI unless specifically indicated in ROS section   GEN: nad, alert and oriented HEENT: mucous membranes moist, tm w/o erythema, nasal exam w/o erythema, clear discharge noted,  OP with cobblestoning, sinuses not ttp.   NECK: supple w/o LA CV: rrr.   PULM: ctab, no inc wob, cough noted.  Chest wall tender diffusely with cough.   EXT: no edema SKIN: well perfused.    Flu neg.  D/w pt at OV.

## 2018-01-11 NOTE — Patient Instructions (Signed)
Flu neg.  Rest and fluids.  Use the albuterol inhaler.   Check with the pharmacy about other cheaper long term inhalers.  Go to the lab on the way out.  We'll contact you with your xray report. Take care.  Glad to see you.  Update me tomorrow.

## 2018-01-12 ENCOUNTER — Other Ambulatory Visit: Payer: Self-pay | Admitting: Family Medicine

## 2018-01-12 DIAGNOSIS — H5210 Myopia, unspecified eye: Secondary | ICD-10-CM | POA: Diagnosis not present

## 2018-01-12 DIAGNOSIS — J189 Pneumonia, unspecified organism: Secondary | ICD-10-CM

## 2018-01-12 MED ORDER — HYDROCODONE-HOMATROPINE 5-1.5 MG/5ML PO SYRP: 5.0000 mL | ORAL_SOLUTION | Freq: Three times a day (TID) | ORAL | 0 refills | Status: DC | PRN

## 2018-01-12 MED ORDER — DOXYCYCLINE HYCLATE 100 MG PO TABS: 100.0000 mg | ORAL_TABLET | Freq: Two times a day (BID) | ORAL | 0 refills | Status: DC

## 2018-01-12 MED ORDER — AMOXICILLIN-POT CLAVULANATE 875-125 MG PO TABS: 1.0000 | ORAL_TABLET | Freq: Two times a day (BID) | ORAL | 0 refills | Status: DC

## 2018-01-13 DIAGNOSIS — R05 Cough: Secondary | ICD-10-CM | POA: Insufficient documentation

## 2018-01-13 DIAGNOSIS — R059 Cough, unspecified: Secondary | ICD-10-CM | POA: Insufficient documentation

## 2018-01-13 NOTE — Assessment & Plan Note (Signed)
CXR with left mid lung field left infiltrate consistent with pneumonia. Start augmentin and doxy. Double cover. Continue inhaler use.  He can update me if not better. If worse, then to ER.  Will need recheck CXR in 6 weeks to make sure this is resolved.  See result notes.   Okay for outpatient f/u.

## 2018-02-24 ENCOUNTER — Telehealth: Payer: Self-pay | Admitting: Family Medicine

## 2018-02-24 NOTE — Telephone Encounter (Signed)
Call pt.  Prev CXR with infiltrate. Needs recheck CXR.  Ordered. Thanks.

## 2018-02-25 NOTE — Telephone Encounter (Signed)
Patient advised.

## 2018-02-25 NOTE — Telephone Encounter (Signed)
Left message for patient to come in for CXR and to call in to signify he has received the message.

## 2018-03-01 ENCOUNTER — Ambulatory Visit (INDEPENDENT_AMBULATORY_CARE_PROVIDER_SITE_OTHER)
Admission: RE | Admit: 2018-03-01 | Discharge: 2018-03-01 | Disposition: A | Discharge status: Home / Self Care | Payer: BLUE CROSS/BLUE SHIELD | Source: Ambulatory Visit | Attending: Family Medicine | Admitting: Family Medicine

## 2018-03-01 DIAGNOSIS — J189 Pneumonia, unspecified organism: Secondary | ICD-10-CM

## 2018-08-01 ENCOUNTER — Other Ambulatory Visit: Payer: Self-pay | Admitting: Family Medicine

## 2018-08-01 DIAGNOSIS — Z823 Family history of stroke: Secondary | ICD-10-CM

## 2018-08-04 ENCOUNTER — Other Ambulatory Visit (INDEPENDENT_AMBULATORY_CARE_PROVIDER_SITE_OTHER): Payer: BLUE CROSS/BLUE SHIELD

## 2018-08-04 DIAGNOSIS — Z823 Family history of stroke: Secondary | ICD-10-CM

## 2018-08-04 LAB — LIPID PANEL
CHOLESTEROL: 154 mg/dL (ref 0–200)
HDL: 35.8 mg/dL — ABNORMAL LOW (ref >39.00)
LDL Cholesterol: 95 mg/dL (ref 0–99)
NONHDL: 118
Total CHOL/HDL Ratio: 4
Triglycerides: 117 mg/dL (ref 0.0–149.0)
VLDL: 23.4 mg/dL (ref 0.0–40.0)

## 2018-08-04 LAB — GLUCOSE, RANDOM: GLUCOSE: 106 mg/dL — AB (ref 70–99)

## 2018-08-09 ENCOUNTER — Encounter (INDEPENDENT_AMBULATORY_CARE_PROVIDER_SITE_OTHER): Payer: Self-pay

## 2018-08-09 ENCOUNTER — Encounter: Payer: Self-pay | Admitting: Family Medicine

## 2018-08-09 ENCOUNTER — Ambulatory Visit (INDEPENDENT_AMBULATORY_CARE_PROVIDER_SITE_OTHER): Payer: BLUE CROSS/BLUE SHIELD | Admitting: Family Medicine

## 2018-08-09 VITALS — BP 136/82 | HR 74 | Temp 98.6°F | Ht 70.75 in | Wt 265.2 lb

## 2018-08-09 DIAGNOSIS — Z7189 Other specified counseling: Secondary | ICD-10-CM

## 2018-08-09 DIAGNOSIS — Z0000 Encounter for general adult medical examination without abnormal findings: Secondary | ICD-10-CM

## 2018-08-09 DIAGNOSIS — R5383 Other fatigue: Secondary | ICD-10-CM | POA: Diagnosis not present

## 2018-08-09 NOTE — Patient Instructions (Addendum)
Check with your insurance to see if they will cover shingrix.  It may cheaper at the pharmacy.   Go to the lab on the way out.  We'll contact you with your lab report. Get back on the treadmill.  If the labs are fine and the fatigue continues in spite of exercise the let me know.   Take care.  Glad to see you.

## 2018-08-09 NOTE — Progress Notes (Signed)
CPE- See plan.  Routine anticipatory guidance given to patient.  See health maintenance.  The possibility exists that previously documented standard health maintenance information may have been brought forward from a previous encounter into this note.  If needed, that same information has been updated to reflect the current situation based on today's encounter.    Tetanus 2018 PNA shot not due Shingles out of stock.   Flu 2019 Colonoscopy 2015.   Prev PSA wnl, defer 2019 Diet and exercise discussed with patient.  HIV screening previously done per patient report. HCV screening prev done per patient report.   Living will discussed with patient. If patient were incapacitated then would have wife designated.  Labs d/w pt.    He is getting up at ~2:15 AM for work.  He is going to bed ~7:30 or 8 PM. He isn't snoring.  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  He has fatigue noted.    See avs.   PMH and SH reviewed  Meds, vitals, and allergies reviewed.   ROS: Per HPI.  Unless specifically indicated otherwise in HPI, the patient denies:  General: fever. Eyes: acute vision changes ENT: sore throat Cardiovascular: chest pain Respiratory: SOB GI: vomiting GU: dysuria Musculoskeletal: acute back pain Derm: acute rash Neuro: acute motor dysfunction Psych: worsening mood Endocrine: polydipsia Heme: bleeding Allergy: hayfever  GEN: nad, alert and oriented HEENT: mucous membranes moist NECK: supple w/o LA CV: rrr. PULM: ctab, no inc wob ABD: soft, +bs EXT: no edema SKIN: no acute rash

## 2018-08-10 LAB — TSH: TSH: 1.84 u[IU]/mL (ref 0.35–4.50)

## 2018-08-10 LAB — CBC WITH DIFFERENTIAL/PLATELET
BASOS ABS: 0.1 10*3/uL (ref 0.0–0.1)
Basophils Relative: 1.1 % (ref 0.0–3.0)
EOS ABS: 0.3 10*3/uL (ref 0.0–0.7)
Eosinophils Relative: 3.7 % (ref 0.0–5.0)
HCT: 42.6 % (ref 39.0–52.0)
Hemoglobin: 14.2 g/dL (ref 13.0–17.0)
LYMPHS ABS: 2 10*3/uL (ref 0.7–4.0)
Lymphocytes Relative: 26.1 % (ref 12.0–46.0)
MCHC: 33.4 g/dL (ref 30.0–36.0)
MCV: 89.2 fl (ref 78.0–100.0)
MONO ABS: 0.6 10*3/uL (ref 0.1–1.0)
MONOS PCT: 7.3 % (ref 3.0–12.0)
NEUTROS PCT: 61.8 % (ref 43.0–77.0)
Neutro Abs: 4.8 10*3/uL (ref 1.4–7.7)
Platelets: 259 10*3/uL (ref 150.0–400.0)
RBC: 4.77 Mil/uL (ref 4.22–5.81)
RDW: 14.4 % (ref 11.5–15.5)
WBC: 7.8 10*3/uL (ref 4.0–10.5)

## 2018-08-11 DIAGNOSIS — R5383 Other fatigue: Secondary | ICD-10-CM | POA: Insufficient documentation

## 2018-08-11 NOTE — Assessment & Plan Note (Signed)
Unclear source.  Check CBC and TSH today.  Discussed with patient about diet and exercise as this could be helpful with his fatigue.  His work schedule complicates his situation.  He is trying to get adequate sleep.  He did pass his DOT physical earlier this year and is not snoring.

## 2018-08-11 NOTE — Assessment & Plan Note (Signed)
Living will discussed with patient. If patient were incapacitated then would have wife designated. 

## 2018-08-11 NOTE — Assessment & Plan Note (Signed)
Tetanus 2018 PNA shot not due Shingles out of stock.   Flu 2019 Colonoscopy 2015.   Prev PSA wnl, defer 2019 Diet and exercise discussed with patient.  HIV screening previously done per patient report. HCV screening prev done per patient report.   Living will discussed with patient. If patient were incapacitated then would have wife designated.

## 2018-08-12 ENCOUNTER — Encounter: Payer: Self-pay | Admitting: *Deleted

## 2018-08-22 DIAGNOSIS — J069 Acute upper respiratory infection, unspecified: Secondary | ICD-10-CM | POA: Diagnosis not present

## 2018-10-19 ENCOUNTER — Other Ambulatory Visit: Payer: Self-pay | Admitting: *Deleted

## 2018-10-19 NOTE — Telephone Encounter (Signed)
Faxed refill request. [facility] Last office visit:   08/09/18 Last Filled:   Was not on current meds list, added for RF request Please advise.

## 2018-10-20 MED ORDER — GLYCOPYRROLATE-FORMOTEROL 9-4.8 MCG/ACT IN AERO: 2.0000 | INHALATION_SPRAY | Freq: Two times a day (BID) | RESPIRATORY_TRACT | 2 refills | Status: DC

## 2018-10-20 NOTE — Telephone Encounter (Signed)
I thought he was on this med prev.  Rx sent in the meantime so he wouldn't run out over the holiday.    If sx are not well controlled then he needs f/u. If this wasn't generated by the patient then have him d/w pharmacy and decline the rx.  Thanks.

## 2018-10-20 NOTE — Telephone Encounter (Signed)
Spoke to pt. He said he received a coupon for it.

## 2018-10-26 ENCOUNTER — Telehealth: Payer: Self-pay | Admitting: *Deleted

## 2018-10-26 NOTE — Telephone Encounter (Signed)
[facility] with additional ppw faxed to be filled out.  PPW placed in Dr. Lianne Bushyuncan's In Box.

## 2018-10-27 NOTE — Telephone Encounter (Signed)
I'll work on the hard copy.  Thanks.  

## 2018-11-09 NOTE — Telephone Encounter (Signed)
Left detailed message on VM to return call with information about previously tried medications.

## 2018-11-10 NOTE — Telephone Encounter (Signed)
Pt returning your call and stated to call him.

## 2018-11-11 NOTE — Telephone Encounter (Addendum)
Patient does not remember having been on any other inhalers on a daily basis.  He says he thinks he was given Albuterol once when he was sick but doesn't remember anything previously on a daily basis. Form placed back in Dr. Lianne Bushyuncan's In Box.

## 2018-11-12 NOTE — Telephone Encounter (Signed)
I'll work on the hard copy.  Thanks.  

## 2018-11-14 MED ORDER — FLUTICASONE-SALMETEROL 250-50 MCG/DOSE IN AEPB: 1.0000 | INHALATION_SPRAY | Freq: Two times a day (BID) | RESPIRATORY_TRACT | 12 refills | Status: DC

## 2018-11-14 NOTE — Addendum Note (Signed)
Addended by: Joaquim NamUNCAN, Shanie Mauzy S on: 11/14/2018 10:08 PM   Modules accepted: Orders

## 2018-11-14 NOTE — Telephone Encounter (Signed)
Would try changing to advair and see if that is effective/affordable  If not both affordable and effective, have him price check symbicort.  rx sent.  Rinse after use.  Update me as needed.  Thanks.

## 2018-11-15 NOTE — Telephone Encounter (Signed)
Spoke with pt. Pt states Advair and Symbicort are about the same price. Pt is okay with stay with advair, advised pt to check with other pharmacies on pricing.

## 2018-11-15 NOTE — Telephone Encounter (Signed)
[facility]

## 2018-11-15 NOTE — Telephone Encounter (Signed)
Spoke with pt to inform. Pt will contact us back if Advair is too expensive.

## 2019-01-12 DIAGNOSIS — H5210 Myopia, unspecified eye: Secondary | ICD-10-CM | POA: Diagnosis not present

## 2019-02-06 IMAGING — DX DG CHEST 2V
2 series · 2 of 2 positions shown · non-contrast
Comparison: Chest x-ray 04/21/2011.

CLINICAL DATA: Cough.

EXAM:
CHEST  2 VIEW

[chest pa]
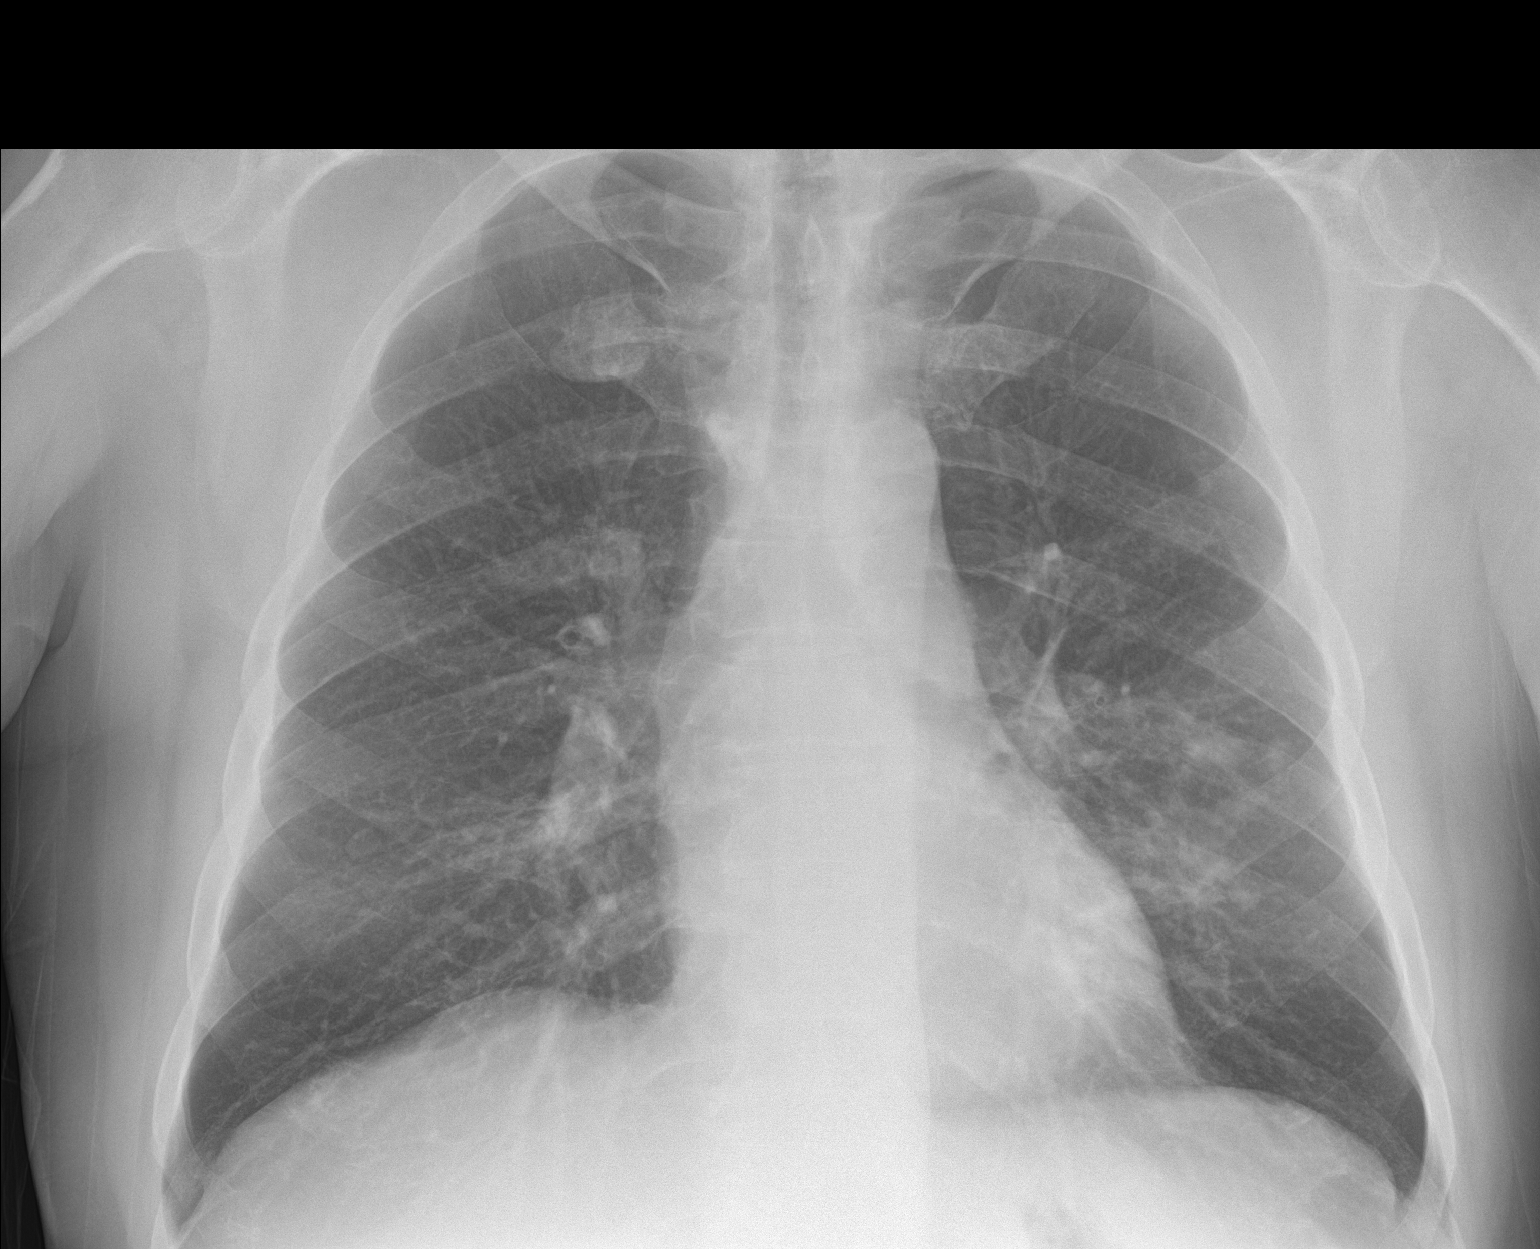

[chest lat]
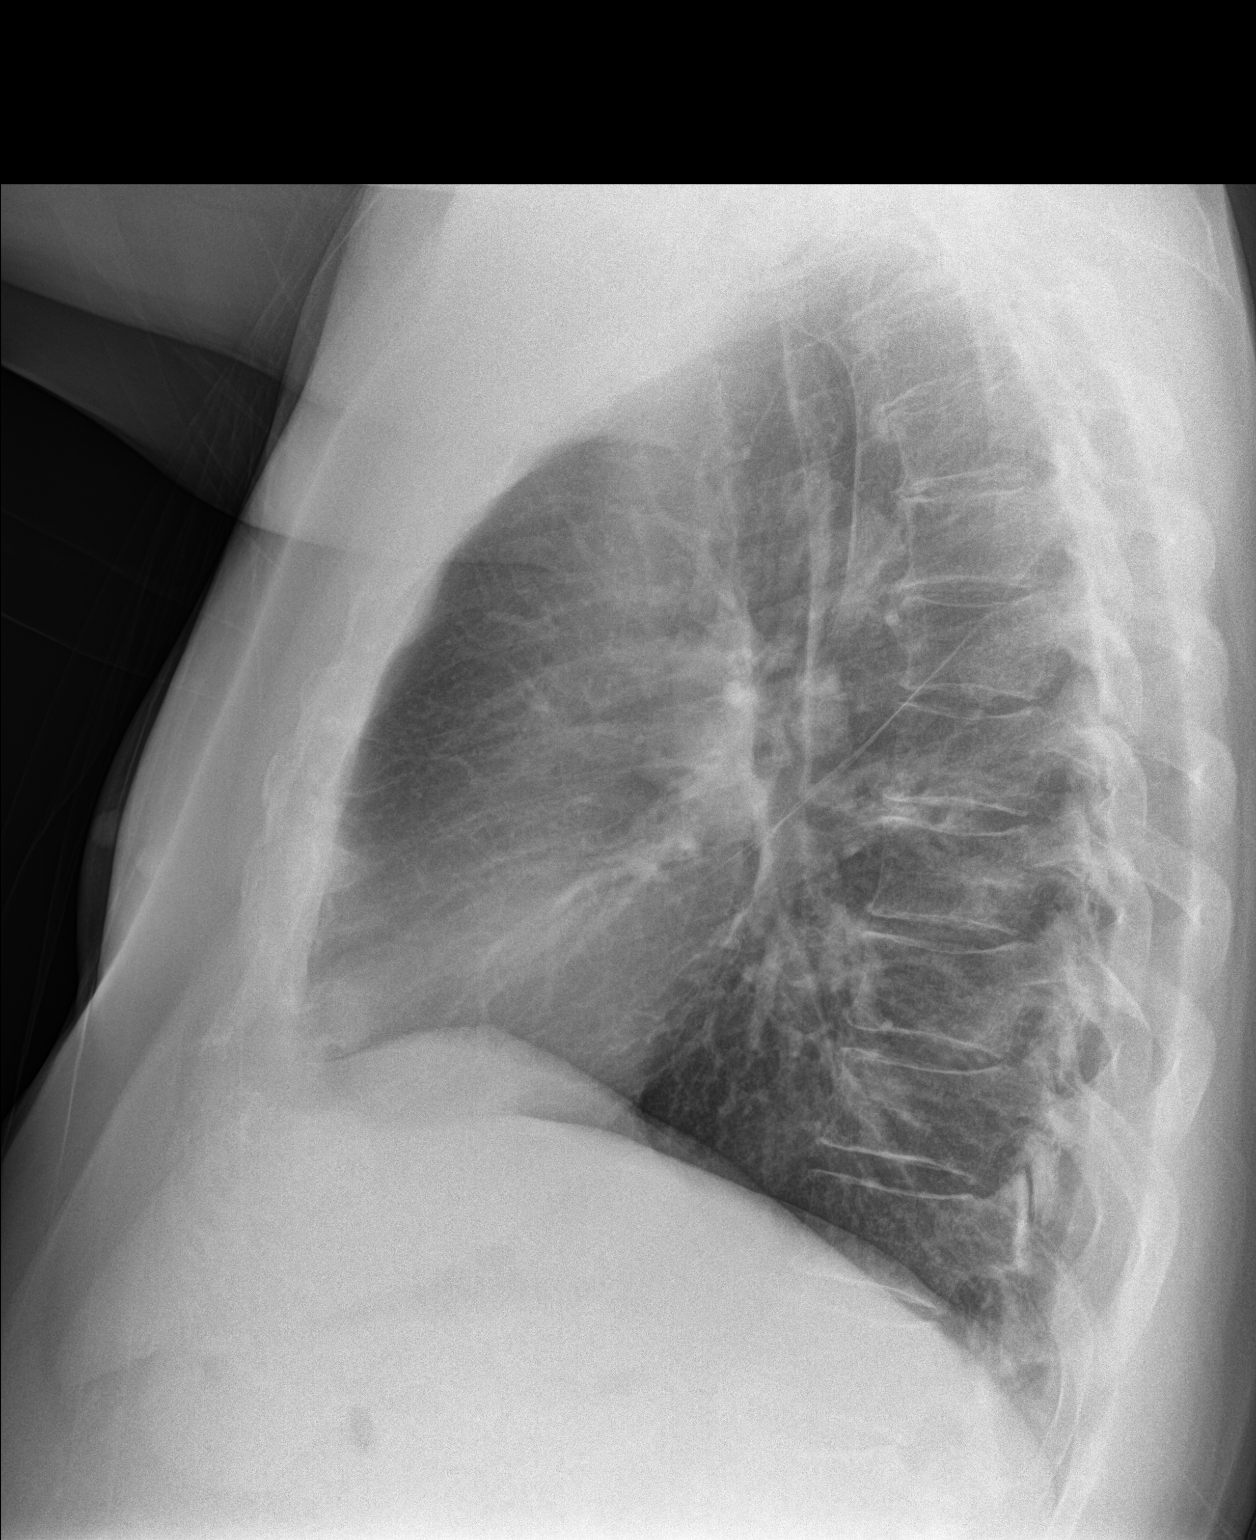

[2 of 2 positions shown; findings below may reference images not displayed]

FINDINGS: Mediastinum hilar structures are normal. Heart size normal. Left mid
lung field infiltrate consistent with pneumonia. No pleural effusion
or pneumothorax. No acute bony abnormality.
IMPRESSION: Left mid lung field left infiltrate consistent with pneumonia.

## 2019-03-17 DIAGNOSIS — D224 Melanocytic nevi of scalp and neck: Secondary | ICD-10-CM | POA: Diagnosis not present

## 2019-03-17 DIAGNOSIS — B354 Tinea corporis: Secondary | ICD-10-CM | POA: Diagnosis not present

## 2019-03-17 DIAGNOSIS — L820 Inflamed seborrheic keratosis: Secondary | ICD-10-CM | POA: Diagnosis not present

## 2019-03-17 DIAGNOSIS — R21 Rash and other nonspecific skin eruption: Secondary | ICD-10-CM | POA: Diagnosis not present

## 2019-05-12 DIAGNOSIS — B354 Tinea corporis: Secondary | ICD-10-CM | POA: Diagnosis not present

## 2019-07-15 ENCOUNTER — Telehealth: Payer: Self-pay | Admitting: Family Medicine

## 2019-07-15 DIAGNOSIS — E786 Lipoprotein deficiency: Secondary | ICD-10-CM

## 2019-07-15 DIAGNOSIS — R7309 Other abnormal glucose: Secondary | ICD-10-CM

## 2019-07-15 NOTE — Telephone Encounter (Signed)
Best number [phone]  Pt called to schedule cpx.  Dr Josefine Class next cpx is 10/9.  Pt stated he needs this before end of sept for insurance.  Can pt be worked in for [employer]

## 2019-07-18 NOTE — Telephone Encounter (Signed)
Add to schedule when possible.  Needs a 30-minute appointment.  I put in lab orders.  Needs lab visit ahead of time.  Thanks.

## 2019-07-18 NOTE — Addendum Note (Signed)
Addended by: Tonia Ghent on: 07/18/2019 06:48 AM   Modules accepted: Orders

## 2019-07-18 NOTE — Telephone Encounter (Signed)
Called pt a left voicemail to call back and work him in the schedule on Sept 21, 22, or 24 at 4pm with labs scheduled a week ahead.

## 2019-07-19 NOTE — Telephone Encounter (Signed)
lvm 2nd attempt

## 2019-07-20 NOTE — Telephone Encounter (Signed)
Pt called back and scheduled labs 9/16 and CPE 9/24

## 2019-08-10 ENCOUNTER — Other Ambulatory Visit: Payer: Self-pay

## 2019-08-10 ENCOUNTER — Other Ambulatory Visit (INDEPENDENT_AMBULATORY_CARE_PROVIDER_SITE_OTHER): Payer: BC Managed Care – PPO

## 2019-08-10 DIAGNOSIS — E786 Lipoprotein deficiency: Secondary | ICD-10-CM | POA: Diagnosis not present

## 2019-08-10 DIAGNOSIS — R7309 Other abnormal glucose: Secondary | ICD-10-CM | POA: Diagnosis not present

## 2019-08-10 LAB — BASIC METABOLIC PANEL
BUN: 17 mg/dL (ref 6–23)
CO2: 31 mEq/L (ref 19–32)
Calcium: 9.5 mg/dL (ref 8.4–10.5)
Chloride: 100 mEq/L (ref 96–112)
Creatinine, Ser: 0.75 mg/dL (ref 0.40–1.50)
GFR: 105.72 mL/min (ref >60.00)
Glucose, Bld: 100 mg/dL — ABNORMAL HIGH (ref 70–99)
Potassium: 4.9 mEq/L (ref 3.5–5.1)
Sodium: 139 mEq/L (ref 135–145)

## 2019-08-10 LAB — LIPID PANEL
Cholesterol: 160 mg/dL (ref 0–200)
HDL: 29.8 mg/dL — ABNORMAL LOW (ref >39.00)
LDL Cholesterol: 104 mg/dL — ABNORMAL HIGH (ref 0–99)
NonHDL: 129.86
Total CHOL/HDL Ratio: 5
Triglycerides: 129 mg/dL (ref 0.0–149.0)
VLDL: 25.8 mg/dL (ref 0.0–40.0)

## 2019-08-18 ENCOUNTER — Encounter: Payer: Self-pay | Admitting: Family Medicine

## 2019-08-18 ENCOUNTER — Ambulatory Visit (INDEPENDENT_AMBULATORY_CARE_PROVIDER_SITE_OTHER): Payer: BC Managed Care – PPO | Admitting: Family Medicine

## 2019-08-18 ENCOUNTER — Other Ambulatory Visit: Payer: Self-pay

## 2019-08-18 VITALS — BP 142/68 | HR 81 | Temp 98.2°F | Ht 70.75 in | Wt 279.0 lb

## 2019-08-18 DIAGNOSIS — Z0000 Encounter for general adult medical examination without abnormal findings: Secondary | ICD-10-CM

## 2019-08-18 DIAGNOSIS — Z23 Encounter for immunization: Secondary | ICD-10-CM

## 2019-08-18 DIAGNOSIS — Z7189 Other specified counseling: Secondary | ICD-10-CM

## 2019-08-18 DIAGNOSIS — J449 Chronic obstructive pulmonary disease, unspecified: Secondary | ICD-10-CM

## 2019-08-18 MED ORDER — FLUTICASONE-SALMETEROL 250-50 MCG/DOSE IN AEPB: 1.0000 | INHALATION_SPRAY | Freq: Two times a day (BID) | RESPIRATORY_TRACT | 12 refills | Status: DC

## 2019-08-18 MED ORDER — ALBUTEROL SULFATE HFA 108 (90 BASE) MCG/ACT IN AERS: 1.0000 | INHALATION_SPRAY | Freq: Four times a day (QID) | RESPIRATORY_TRACT | 5 refills | Status: DC | PRN

## 2019-08-18 NOTE — Patient Instructions (Addendum)
Check with your insurance to see if they will cover the shingrix shot. Please call GI about follow up.  Take care.  Glad to see you.  Update me as needed.

## 2019-08-18 NOTE — Progress Notes (Signed)
CPE- See plan.  Routine anticipatory guidance given to patient.  See health maintenance.  The possibility exists that previously documented standard health maintenance information may have been brought forward from a previous encounter into this note.  If needed, that same information has been updated to reflect the current situation based on today's encounter.    Tetanus 2018 PNA shot not due Shingles d/w pt.   Flu 2020 Colonoscopy 2015.  he'll call about follow up.   Prev PSA wnl, defer 2020, d/w pt. He agrees.  Diet and exercise discussed with patient.  HIV screening previously done per patient report. HCV screening prev done per patient report.   Living will discussed with patient. If patient were incapacitated then would have wife designated.  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    He had seen [facility] for a rash.  Rash resolved in the meantime.    COPD.  Rinsing after advair use. No ADE on med.  Compliant.  Not SOB. Rare wheeze/cough.  He is improved on inhaler.    PMH and SH reviewed  Meds, vitals, and allergies reviewed.   ROS: Per HPI.  Unless specifically indicated otherwise in HPI, the patient denies:  General: fever. Eyes: acute vision changes ENT: sore throat Cardiovascular: chest pain Respiratory: SOB GI: vomiting GU: dysuria Musculoskeletal: acute back pain Derm: acute rash Neuro: acute motor dysfunction Psych: worsening mood Endocrine: polydipsia Heme: bleeding Allergy: hayfever  GEN: nad, alert and oriented HEENT: ncat NECK: supple w/o LA CV: rrr. PULM: ctab, no inc wob ABD: soft, +bs EXT: no edema SKIN: no acute rash

## 2019-08-21 NOTE — Assessment & Plan Note (Signed)
Living will discussed with patient. If patient were incapacitated then would have wife designated. 

## 2019-08-21 NOTE — Assessment & Plan Note (Signed)
Rinsing after advair use. No ADE on med.  Compliant.  Not SOB. Rare wheeze/cough.  He is improved on inhaler.   Continue as is.

## 2019-08-21 NOTE — Assessment & Plan Note (Signed)
Tetanus 2018 PNA shot not due Shingles d/w pt.   Flu 2020 Colonoscopy 2015.  he'll call about follow up.   Prev PSA wnl, defer 2020, d/w pt. He agrees.  Diet and exercise discussed with patient.  HIV screening previously done per patient report. HCV screening prev done per patient report.   Living will discussed with patient. If patient were incapacitated then would have wife designated.

## 2020-01-02 DIAGNOSIS — M7731 Calcaneal spur, right foot: Secondary | ICD-10-CM | POA: Diagnosis not present

## 2020-01-02 DIAGNOSIS — L600 Ingrowing nail: Secondary | ICD-10-CM | POA: Diagnosis not present

## 2020-01-02 DIAGNOSIS — M722 Plantar fascial fibromatosis: Secondary | ICD-10-CM | POA: Diagnosis not present

## 2020-01-02 DIAGNOSIS — M79671 Pain in right foot: Secondary | ICD-10-CM | POA: Diagnosis not present

## 2020-01-23 DIAGNOSIS — M722 Plantar fascial fibromatosis: Secondary | ICD-10-CM | POA: Diagnosis not present

## 2020-05-11 DIAGNOSIS — M722 Plantar fascial fibromatosis: Secondary | ICD-10-CM | POA: Diagnosis not present

## 2020-06-01 DIAGNOSIS — M722 Plantar fascial fibromatosis: Secondary | ICD-10-CM | POA: Diagnosis not present

## 2020-08-19 ENCOUNTER — Other Ambulatory Visit: Payer: Self-pay | Admitting: Family Medicine

## 2020-08-19 DIAGNOSIS — E786 Lipoprotein deficiency: Secondary | ICD-10-CM

## 2020-08-20 ENCOUNTER — Telehealth: Payer: Self-pay | Admitting: *Deleted

## 2020-08-20 ENCOUNTER — Ambulatory Visit (INDEPENDENT_AMBULATORY_CARE_PROVIDER_SITE_OTHER): Payer: BC Managed Care – PPO

## 2020-08-20 ENCOUNTER — Encounter: Payer: Self-pay | Admitting: Emergency Medicine

## 2020-08-20 ENCOUNTER — Ambulatory Visit
Admission: EM | Admit: 2020-08-20 | Discharge: 2020-08-20 | Disposition: A | Discharge status: Home / Self Care | Payer: BC Managed Care – PPO | Attending: Physician Assistant | Admitting: Physician Assistant

## 2020-08-20 ENCOUNTER — Other Ambulatory Visit: Payer: Self-pay

## 2020-08-20 DIAGNOSIS — R0602 Shortness of breath: Secondary | ICD-10-CM

## 2020-08-20 DIAGNOSIS — R059 Cough, unspecified: Secondary | ICD-10-CM

## 2020-08-20 DIAGNOSIS — B349 Viral infection, unspecified: Secondary | ICD-10-CM | POA: Diagnosis not present

## 2020-08-20 DIAGNOSIS — R05 Cough: Secondary | ICD-10-CM

## 2020-08-20 DIAGNOSIS — J441 Chronic obstructive pulmonary disease with (acute) exacerbation: Secondary | ICD-10-CM | POA: Diagnosis not present

## 2020-08-20 DIAGNOSIS — U071 COVID-19: Secondary | ICD-10-CM | POA: Diagnosis not present

## 2020-08-20 MED ORDER — GUAIFENESIN-CODEINE 100-10 MG/5ML PO SYRP
10.0000 mL | ORAL_SOLUTION | Freq: Four times a day (QID) | ORAL | 0 refills | Status: AC | PRN
Start: 2020-08-20 — End: 2020-08-25

## 2020-08-20 MED ORDER — AZITHROMYCIN 250 MG PO TABS: 250.0000 mg | ORAL_TABLET | Freq: Every day | ORAL | 0 refills | Status: DC

## 2020-08-20 MED ORDER — PREDNISONE 20 MG PO TABS: 40.0000 mg | ORAL_TABLET | Freq: Every day | ORAL | 0 refills | Status: AC

## 2020-08-20 NOTE — Discharge Instructions (Addendum)

## 2020-08-20 NOTE — Telephone Encounter (Signed)
Patient called stating that he started getting sick Friday with head congestion, cough, body aches, chills and loss of taste and smell. Patient stated that he did a home covid test Sunday and it was positive. Patient stated that he has coughed so much his ribs hurt. Patient is unable to check his temperature because he does not have a thermometer. Patient stated that he is wheezing some when he coughs. Patient stated that he has used his inhaler some. Patient stated that he has a productive cough which is light yellow. Patient stated that he received a [employer] covid vaccine at [facility] on 06/27/20. Patient was advised that he needs a face to face evaluation and needs to go to an Urgent Care. Patient agreed and stated that he is heading to [facility] now. Patient was advised that he needs to quarantine and he verbalized understanding. Patient was advised that he may qualify for an infusion and Dr. Para March will review his chart and send his information to the infusion team. Patient stated that he has two upcoming appointments and needs those cancelled until he gets over covid. Appointments cancelled per patient's request.  Covid vaccine documented in Epic per patient.

## 2020-08-20 NOTE — ED Provider Notes (Addendum)
[facility]    CSN: [id number] Arrival date & time: 08/20/20  1155      History   Chief Complaint Chief Complaint  Patient presents with  . Covid Positive  . Cough    HPI Juan Knight is a 64 y.o. male presenting for cough, nasal congestion, fatigue, headaches x2 days.  He states that he took an at home Covid test yesterday and it was positive.  Patient says he does have a history of COPD.  He uses an inhaler as needed.  He denies any chest pain or breathing difficulty.  He says he has had wheezing at times.  Patient says he has felt feverish and had chills and sweats but denies any fever.  He says that he has completely lost his smell and taste.  Patient denies any known Covid exposure.  Patient fully vaccinated for Covid with the [employer] vaccine.  Patient denies any other complaints or concerns.  HPI  Past Medical History:  Diagnosis Date  . COPD (chronic obstructive pulmonary disease) (HCC)   . OA (osteoarthritis)    occ knee pain    Patient Active Problem List   Diagnosis Date Noted  . Other fatigue 08/11/2018  . Administrative encounter 08/21/2017  . COPD (chronic obstructive pulmonary disease) (HCC) 02/13/2017  . Routine general medical examination at a health care facility 02/13/2017  . Advance care planning 02/13/2017    Past Surgical History:  Procedure Laterality Date  . APPENDECTOMY    . KNEE ARTHROSCOPY    . LUMBAR SPINE SURGERY    . RECTOPERITONEAL FISTULA CLOSURE    . TONSILLECTOMY         Home Medications    Prior to Admission medications   Medication Sig Start Date End Date Taking? Authorizing Provider  albuterol (VENTOLIN HFA) 108 (90 Base) MCG/ACT inhaler Inhale 1-2 puffs into the lungs every 6 (six) hours as needed. 08/18/19  Yes Joaquim Nam, MD  Fluticasone-Salmeterol (ADVAIR DISKUS) 250-50 MCG/DOSE AEPB Inhale 1 puff into the lungs 2 (two) times daily. 08/18/19  Yes Joaquim Nam, MD  azithromycin (ZITHROMAX)  250 MG tablet Take 1 tablet (250 mg total) by mouth daily. Take first 2 tablets together, then 1 every day until finished. 08/20/20   Shirlee Latch, PA-C  Cholecalciferol (VITAMIN D3) 5000 units CAPS Take by mouth daily.    [provider]  guaiFENesin-codeine (ROBITUSSIN AC) 100-10 MG/5ML syrup Take 10 mLs by mouth 4 (four) times daily as needed for up to 5 days for cough. 08/20/20 08/25/20  Shirlee Latch, PA-C  predniSONE (DELTASONE) 20 MG tablet Take 2 tablets (40 mg total) by mouth daily for 5 days. 08/20/20 08/25/20  Shirlee Latch, PA-C    Family History Family History  Problem Relation Age of Onset  . COPD Mother   . Arthritis Mother        rheumatoid arthritis  . Lupus Mother   . Leukemia Father   . Colon cancer Neg Hx   . Prostate cancer Neg Hx     Social History Social History   Tobacco Use  . Smoking status: Former Smoker    Packs/day: 1.00    Years: 40.00    Pack years: 40.00    Quit date: 11/24/2010    Years since quitting: 9.7  . Smokeless tobacco: Never Used  Substance Use Topics  . Alcohol use: Yes    Comment: Six pack per week  . Drug use: No  Allergies   Patient has no known allergies.   Review of Systems Review of Systems  Constitutional: Positive for chills, diaphoresis and fatigue. Negative for fever.  HENT: Positive for congestion and rhinorrhea. Negative for sore throat.        Loss of smell and taste  Respiratory: Positive for cough and wheezing. Negative for shortness of breath.   Cardiovascular: Negative for chest pain and palpitations.  Gastrointestinal: Negative for abdominal pain, nausea and vomiting.  Musculoskeletal: Positive for myalgias.  Skin: Negative for rash.  Neurological: Positive for headaches. Negative for weakness.     Physical Exam Triage Vital Signs ED Triage Vitals  Enc Vitals Group     BP 08/20/20 1358 125/78     Pulse Rate 08/20/20 1358 91     Resp 08/20/20 1358 18     Temp 08/20/20 1358 99.1 F  (37.3 C)     Temp Source 08/20/20 1358 Oral     SpO2 08/20/20 1358 98 %     Weight 08/20/20 1356 275 lb (124.7 kg)     Height 08/20/20 1356 5' 10.5" (1.791 m)     Head Circumference --      Peak Flow --      Pain Score 08/20/20 1355 6     Pain Loc --      Pain Edu? --      Excl. in GC? --    No data found.  Updated Vital Signs BP 125/78 (BP Location: Right Arm)   Pulse 91   Temp 99.1 F (37.3 C) (Oral)   Resp 18   Ht 5' 10.5" (1.791 m)   Wt 275 lb (124.7 kg)   SpO2 98%   BMI 38.90 kg/m       Physical Exam Vitals and nursing note reviewed.  Constitutional:      General: He is not in acute distress.    Appearance: Normal appearance. He is well-developed. He is obese. He is not ill-appearing, toxic-appearing or diaphoretic.  HENT:     Head: Normocephalic and atraumatic.     Nose: Congestion and rhinorrhea present.     Mouth/Throat:     Mouth: Mucous membranes are moist.     Pharynx: Oropharynx is clear. Posterior oropharyngeal erythema present.  Eyes:     General: No scleral icterus.    Conjunctiva/sclera: Conjunctivae normal.  Cardiovascular:     Rate and Rhythm: Normal rate and regular rhythm.     Heart sounds: Normal heart sounds.  Pulmonary:     Effort: Pulmonary effort is normal. No respiratory distress.     Breath sounds: Wheezing (few scattered wheezes left lung) present. No rhonchi or rales.  Musculoskeletal:     Cervical back: Neck supple.  Skin:    General: Skin is warm and dry.  Neurological:     General: No focal deficit present.     Mental Status: He is alert. Mental status is at baseline.     Motor: No weakness.     Gait: Gait normal.  Psychiatric:        Mood and Affect: Mood normal.        Behavior: Behavior normal.        Thought Content: Thought content normal.      UC Treatments / Results  Labs (all labs ordered are listed, but only abnormal results are displayed) Labs Reviewed  SARS CORONAVIRUS 2 (TAT 6-24 HRS) - Abnormal; Notable  for the following components:      Result Value  SARS Coronavirus 2 POSITIVE (*)    All other components within normal limits    EKG   Radiology DG Chest 2 View  Addendum Date: 08/20/2020   ADDENDUM REPORT: 08/20/2020 15:30 ADDENDUM: A repeat study with nipple markers was obtained. The area questioned earlier in the day represents a nipple shadow. Lungs clear. Electronically Signed   By: Bretta BangWilliam  Woodruff III M.D.   On: 08/20/2020 15:30   Result Date: 08/20/2020 CLINICAL DATA:  Cough and shortness of breath.  COVID-19 positive EXAM: CHEST - 2 VIEW COMPARISON:  March 01, 2018 FINDINGS: There is a suspected nipple shadow on the right. No edema or airspace opacity. Heart size and pulmonary vascularity are normal. No adenopathy. No bone lesions. IMPRESSION: Suspected nipple shadow on the right; repeat study with nipple markers could confirm. Lungs elsewhere clear. Heart size normal. Electronically Signed: By: Bretta BangWilliam  Woodruff III M.D. On: 08/20/2020 14:56    Procedures Procedures (including critical care time)  Medications Ordered in UC Medications - No data to display  Initial Impression / Assessment and Plan / UC Course  I have reviewed the triage vital signs and the nursing notes.  Pertinent labs & imaging results that were available during my care of the patient were reviewed by me and considered in my medical decision making (see chart for details).   64 year old male with 2-day history of cough, congestion, loss of smell and taste and increased wheezing.  Covid test obtained.  Discussed with patient excellent result.  Chest x-ray within normal limits today.  Independently reviewed by me.  Patient says he does have a history of COPD.  Treating at this time for COPD exacerbation with azithromycin, prednisone, and Cheratussin.  Based on symptoms, I have high suspicion that patient has Covid.  Discussed MAB therapy with patient and he says that he would be very interested in having this  done if positive.  Advised patient that I will call him in the morning with results and if positive I will refer him to the infusion clinic for MAB therapy since he is a candidate.  In the meantime, he should rest increase fluid intake.  If at any point he has any fevers he cannot bring down Tylenol or Motrin, weakness, chest pain, increased breathing difficulty or any other acute worsening of symptoms, he should go to the emergency department.  Patient is understanding and agreeable.  08/21/2020: COVID positive. Referring to infusion clinic for MAB therapy. Has COPD and obesity. Also called to notify patient.   Final Clinical Impressions(s) / UC Diagnoses   Final diagnoses:  Viral illness  Cough  COPD exacerbation (HCC)  COVID-19     Discharge Instructions     You have received COVID testing today either for positive exposure, concerning symptoms that could be related to COVID infection, screening purposes, or re-testing after confirmed positive.  Your test obtained today checks for active viral infection in the last 1-2 weeks. If your test is negative now, you can still test positive later. So, if you do develop symptoms you should either get re-tested and/or isolate x 10 days. Please follow CDC guidelines.  While Rapid antigen tests come back in 15-20 minutes, send out PCR/molecular test results typically come back within 24 hours. In the mean time, if you are symptomatic, assume this could be a positive test and treat/monitor yourself as if you do have COVID.   We will call with test results. Please download the MyChart app and set up a profile to access  test results.   If symptomatic, go home and rest. Push fluids. Take Tylenol as needed for discomfort. Gargle warm salt water. Throat lozenges. Take Mucinex DM or Robitussin for cough. Humidifier in bedroom to ease coughing. Warm showers. Also review the COVID handout for more information.  COVID-19 INFECTION: The incubation period of  COVID-19 is approximately 14 days after exposure, with most symptoms developing in roughly 4-5 days. Symptoms may range in severity from mild to critically severe. Roughly 80% of those infected will have mild symptoms. People of any age may become infected with COVID-19 and have the ability to transmit the virus. The most common symptoms include: fever, fatigue, cough, body aches, headaches, sore throat, nasal congestion, shortness of breath, nausea, vomiting, diarrhea, changes in smell and/or taste.    COURSE OF ILLNESS Some patients may begin with mild disease which can progress quickly into critical symptoms. If your symptoms are worsening please call ahead to the Emergency Department and proceed there for further treatment. Recovery time appears to be roughly 1-2 weeks for mild symptoms and 3-6 weeks for severe disease.   GO IMMEDIATELY TO ER FOR FEVER YOU ARE UNABLE TO GET DOWN WITH TYLENOL, BREATHING PROBLEMS, CHEST PAIN, FATIGUE, LETHARGY, INABILITY TO EAT OR DRINK, ETC  QUARANTINE AND ISOLATION: To help decrease the spread of COVID-19 please remain isolated if you have COVID infection or are highly suspected to have COVID infection. This means -stay home and isolate to one room in the home if you live with others. Do not share a bed or bathroom with others while ill, sanitize and wipe down all countertops and keep common areas clean and disinfected. You may discontinue isolation if you have a mild case and are asymptomatic 10 days after symptom onset as long as you have been fever free >24 hours without having to take Motrin or Tylenol. If your case is more severe (meaning you develop pneumonia or are admitted in the hospital), you may have to isolate longer.   If you have been in close contact (within 6 feet) of someone diagnosed with COVID 19, you are advised to quarantine in your home for 14 days as symptoms can develop anywhere from 2-14 days after exposure to the virus. If you develop symptoms,  you  must isolate.  Most current guidelines for COVID after exposure -isolate 10 days if you ARE NOT tested for COVID as long as symptoms do not develop -isolate 7 days if you are tested and remain asymptomatic -You do not necessarily need to be tested for COVID if you have + exposure and        develop   symptoms. Just isolate at home x10 days from symptom onset During this global pandemic, CDC advises to practice social distancing, try to stay at least 40ft away from others at all times. Wear a face covering. Wash and sanitize your hands regularly and avoid going anywhere that is not necessary.  KEEP IN MIND THAT THE COVID TEST IS NOT 100% ACCURATE AND YOU SHOULD STILL DO EVERYTHING TO PREVENT POTENTIAL SPREAD OF VIRUS TO OTHERS (WEAR MASK, WEAR GLOVES, WASH HANDS AND SANITIZE REGULARLY). IF INITIAL TEST IS NEGATIVE, THIS MAY NOT MEAN YOU ARE DEFINITELY NEGATIVE. MOST ACCURATE TESTING IS DONE 5-7 DAYS AFTER EXPOSURE.   It is not advised by CDC to get re-tested after receiving a positive COVID test since you can still test positive for weeks to months after you have already cleared the virus.   *If you have not been  vaccinated for COVID, I strongly suggest you consider getting vaccinated as long as there are no contraindications.      ED Prescriptions    Medication Sig Dispense Auth. Provider   azithromycin (ZITHROMAX) 250 MG tablet Take 1 tablet (250 mg total) by mouth daily. Take first 2 tablets together, then 1 every day until finished. 6 tablet Eusebio Friendly B, PA-C   predniSONE (DELTASONE) 20 MG tablet Take 2 tablets (40 mg total) by mouth daily for 5 days. 10 tablet Shirlee Latch, PA-C   guaiFENesin-codeine (ROBITUSSIN AC) 100-10 MG/5ML syrup Take 10 mLs by mouth 4 (four) times daily as needed for up to 5 days for cough. 200 mL Shirlee Latch, PA-C     PDMP not reviewed this encounter.   Shirlee Latch, PA-C 08/20/20 1611    Shirlee Latch, PA-C 08/21/20 [phone]

## 2020-08-20 NOTE — ED Triage Notes (Signed)
Patient states he tested positive for COVID yesterday on a home test. He called his PCP today and was advised to come to [facility] for CXR due to cough.

## 2020-08-21 ENCOUNTER — Other Ambulatory Visit: Payer: BC Managed Care – PPO

## 2020-08-21 ENCOUNTER — Other Ambulatory Visit: Payer: Self-pay | Admitting: Oncology

## 2020-08-21 ENCOUNTER — Encounter: Payer: Self-pay | Admitting: Oncology

## 2020-08-21 DIAGNOSIS — U071 COVID-19: Secondary | ICD-10-CM

## 2020-08-21 LAB — SARS CORONAVIRUS 2 (TAT 6-24 HRS): SARS Coronavirus 2: POSITIVE — AB

## 2020-08-21 NOTE — Telephone Encounter (Signed)
Please update patient.  Covid positive.  I sent a message to infusion clinic staff for their input.  They should call him.  If worse in the meantime then go to the emergency room.  I hope he feels better soon.  Thanks.

## 2020-08-21 NOTE — Telephone Encounter (Signed)
Info sent to [facility].  Thanks.

## 2020-08-21 NOTE — Progress Notes (Signed)
I connected by phone with  Mr. Chavarria to discuss the potential use of an new treatment for mild to moderate COVID-19 viral infection in non-hospitalized patients.   This patient is a age/sex that meets the FDA criteria for Emergency Use Authorization of casirivimab\imdevimab.  Has a (+) direct SARS-CoV-2 viral test result 1. Has mild or moderate COVID-19  2. Is ? 62 years of age and weighs ? 40 kg 3. Is NOT hospitalized due to COVID-19 4. Is NOT requiring oxygen therapy or requiring an increase in baseline oxygen flow rate due to COVID-19 5. Is within 10 days of symptom onset 6. Has at least one of the high risk factor(s) for progression to severe COVID-19 and/or hospitalization as defined in EUA. Specific high risk criteria : Past Medical History:  Diagnosis Date  . COPD (chronic obstructive pulmonary disease) (HCC)   . OA (osteoarthritis)    occ knee pain  ?  ?    Symptom onset  08/18/20   I have spoken and communicated the following to the patient or parent/caregiver:   1. FDA has authorized the emergency use of casirivimab\imdevimab for the treatment of mild to moderate COVID-19 in adults and pediatric patients with positive results of direct SARS-CoV-2 viral testing who are 45 years of age and older weighing at least 40 kg, and who are at high risk for progressing to severe COVID-19 and/or hospitalization.   2. The significant known and potential risks and benefits of casirivimab\imdevimab, and the extent to which such potential risks and benefits are unknown.   3. Information on available alternative treatments and the risks and benefits of those alternatives, including clinical trials.   4. Patients treated with casirivimab\imdevimab should continue to self-isolate and use infection control measures (e.g., wear mask, isolate, social distance, avoid sharing personal items, clean and disinfect "high touch" surfaces, and frequent handwashing) according to CDC guidelines.    5. The  patient or parent/caregiver has the option to accept or refuse casirivimab\imdevimab .   After reviewing this information with the patient, The patient agreed to proceed with receiving casirivimab\imdevimab infusion and will be provided a copy of the Fact sheet prior to receiving the infusion.Mignon Pine, AGNP-C [phone] (Infusion Center Hotline)

## 2020-08-21 NOTE — Telephone Encounter (Signed)
Juan Knight has agreed to do these for this clinic at her lunch time if all of the appropriate information is sent to her.

## 2020-08-22 ENCOUNTER — Ambulatory Visit (HOSPITAL_COMMUNITY)
Admission: RE | Admit: 2020-08-22 | Discharge: 2020-08-22 | Disposition: A | Discharge status: Home / Self Care | Payer: BC Managed Care – PPO | Source: Ambulatory Visit | Attending: Pulmonary Disease | Admitting: Pulmonary Disease

## 2020-08-22 DIAGNOSIS — U071 COVID-19: Secondary | ICD-10-CM | POA: Diagnosis not present

## 2020-08-22 MED ORDER — EPINEPHRINE 0.3 MG/0.3ML IJ SOAJ: 0.3000 mg | Freq: Once | INTRAMUSCULAR | Status: DC | PRN

## 2020-08-22 MED ORDER — SODIUM CHLORIDE 0.9 % IV SOLN
1200.0000 mg | Freq: Once | INTRAVENOUS | Status: AC
  Administered 2020-08-22: 1200 mg via INTRAVENOUS

## 2020-08-22 MED ORDER — ALBUTEROL SULFATE HFA 108 (90 BASE) MCG/ACT IN AERS: 2.0000 | INHALATION_SPRAY | Freq: Once | RESPIRATORY_TRACT | Status: DC | PRN

## 2020-08-22 MED ORDER — FAMOTIDINE IN NACL 20-0.9 MG/50ML-% IV SOLN: 20.0000 mg | Freq: Once | INTRAVENOUS | Status: DC | PRN

## 2020-08-22 MED ORDER — METHYLPREDNISOLONE SODIUM SUCC 125 MG IJ SOLR: 125.0000 mg | Freq: Once | INTRAMUSCULAR | Status: DC | PRN

## 2020-08-22 MED ORDER — SODIUM CHLORIDE 0.9 % IV SOLN: INTRAVENOUS | Status: DC | PRN

## 2020-08-22 MED ORDER — DIPHENHYDRAMINE HCL 50 MG/ML IJ SOLN: 50.0000 mg | Freq: Once | INTRAMUSCULAR | Status: DC | PRN

## 2020-08-22 NOTE — Discharge Instructions (Signed)

## 2020-08-22 NOTE — Progress Notes (Signed)
  Diagnosis: COVID-19  Physician:Dr Wright  Procedure: Covid Infusion Clinic Med: casirivimab\imdevimab infusion - Provided patient with casirivimab\imdevimab fact sheet for patients, parents and caregivers prior to infusion.  Complications: No immediate complications noted.  Discharge: Discharged home   Sloane Junkin R Shropshire 08/22/2020  

## 2020-08-23 ENCOUNTER — Encounter: Payer: BC Managed Care – PPO | Admitting: Family Medicine

## 2020-09-10 ENCOUNTER — Other Ambulatory Visit (INDEPENDENT_AMBULATORY_CARE_PROVIDER_SITE_OTHER): Payer: BC Managed Care – PPO

## 2020-09-10 ENCOUNTER — Other Ambulatory Visit: Payer: Self-pay

## 2020-09-10 DIAGNOSIS — E786 Lipoprotein deficiency: Secondary | ICD-10-CM

## 2020-09-10 LAB — LIPID PANEL
Cholesterol: 156 mg/dL (ref 0–200)
HDL: 34 mg/dL — ABNORMAL LOW (ref >39.00)
LDL Cholesterol: 95 mg/dL (ref 0–99)
NonHDL: 122.24
Total CHOL/HDL Ratio: 5
Triglycerides: 136 mg/dL (ref 0.0–149.0)
VLDL: 27.2 mg/dL (ref 0.0–40.0)

## 2020-09-10 LAB — BASIC METABOLIC PANEL
BUN: 12 mg/dL (ref 6–23)
CO2: 29 mEq/L (ref 19–32)
Calcium: 9.1 mg/dL (ref 8.4–10.5)
Chloride: 102 mEq/L (ref 96–112)
Creatinine, Ser: 0.72 mg/dL (ref 0.40–1.50)
GFR: 99.6 mL/min (ref >60.00)
Glucose, Bld: 100 mg/dL — ABNORMAL HIGH (ref 70–99)
Potassium: 4.2 mEq/L (ref 3.5–5.1)
Sodium: 139 mEq/L (ref 135–145)

## 2020-09-17 ENCOUNTER — Ambulatory Visit (INDEPENDENT_AMBULATORY_CARE_PROVIDER_SITE_OTHER): Payer: BC Managed Care – PPO | Admitting: Family Medicine

## 2020-09-17 ENCOUNTER — Encounter: Payer: Self-pay | Admitting: Family Medicine

## 2020-09-17 ENCOUNTER — Other Ambulatory Visit: Payer: Self-pay

## 2020-09-17 VITALS — BP 130/70 | HR 73 | Temp 97.7°F | Ht 70.5 in | Wt 267.2 lb

## 2020-09-17 DIAGNOSIS — Z0000 Encounter for general adult medical examination without abnormal findings: Secondary | ICD-10-CM

## 2020-09-17 DIAGNOSIS — R5383 Other fatigue: Secondary | ICD-10-CM

## 2020-09-17 DIAGNOSIS — J449 Chronic obstructive pulmonary disease, unspecified: Secondary | ICD-10-CM

## 2020-09-17 DIAGNOSIS — Z7189 Other specified counseling: Secondary | ICD-10-CM

## 2020-09-17 DIAGNOSIS — E786 Lipoprotein deficiency: Secondary | ICD-10-CM

## 2020-09-17 DIAGNOSIS — R739 Hyperglycemia, unspecified: Secondary | ICD-10-CM

## 2020-09-17 MED ORDER — FLUTICASONE PROPIONATE 50 MCG/ACT NA SUSP: 2.0000 | Freq: Every day | NASAL | 6 refills | Status: DC

## 2020-09-17 MED ORDER — ALBUTEROL SULFATE HFA 108 (90 BASE) MCG/ACT IN AERS: 1.0000 | INHALATION_SPRAY | Freq: Four times a day (QID) | RESPIRATORY_TRACT | 5 refills | Status: DC | PRN

## 2020-09-17 MED ORDER — FLUTICASONE-SALMETEROL 250-50 MCG/DOSE IN AEPB
1.0000 | INHALATION_SPRAY | Freq: Two times a day (BID) | RESPIRATORY_TRACT | 12 refills | Status: DC
Start: 2020-09-17 — End: 2021-08-23

## 2020-09-17 NOTE — Progress Notes (Signed)
This visit occurred during the SARS-CoV-2 public health emergency.  Safety protocols were in place, including screening questions prior to the visit, additional usage of staff PPE, and extensive cleaning of exam room while observing appropriate contact time as indicated for disinfecting solutions.  CPE- See plan.  Routine anticipatory guidance given to patient.  See health maintenance.  The possibility exists that previously documented standard health maintenance information may have been brought forward from a previous encounter into this note.  If needed, that same information has been updated to reflect the current situation based on today's encounter.    Tetanus 2018 PNA d/w pt.   Shingles d/w pt.   Flu to be done 2021 when feeling better from covid.   covid vaccine 2021.   Colonoscopy 2015. he'll call about follow up vs update me if referral needed.   Prostate cancer screening and PSA options (with potential risks and benefits of testing vs not testing) were discussed along with recent recs/guidelines.  He declined testing PSA at this point. Diet and exercise discussed with patient.  HIV screening previously done per patient report. HCV screening prev done per patient report.  Living will discussed with patient. If patient were incapacitated then would have wife designated.  He had covid.  In the meantime, he has noted less response to advair.  He had used SABA more with covid, less in the meantime, down to 1-2x/week.  Covid d/w pt.  He had Ab infusion.  Overall rare wheeze.    He is working variable hours, on demand scheduling.  It affects his sleep cycle and he is trying to manage this in the meantime.    We talked about rechecking his lipids and glucose in 11/2020.    He has good exertional capacity.  He can work hard w/o getting chest pain.  He has occ L sided chest discomfort at rest which he is going to monitor.  He clearly does not have exertional symptoms.  PMH and SH  reviewed  Meds, vitals, and allergies reviewed.   ROS: Per HPI.  Unless specifically indicated otherwise in HPI, the patient denies:  General: fever. Eyes: acute vision changes ENT: sore throat Cardiovascular: chest pain Respiratory: SOB GI: vomiting GU: dysuria Musculoskeletal: acute back pain Derm: acute rash Neuro: acute motor dysfunction Psych: worsening mood Endocrine: polydipsia Heme: bleeding Allergy: hayfever  GEN: nad, alert and oriented HEENT: ncat, tympanic membranes without erythema but he does have right-sided serous otitis media noted (discussed that it would be okay to use Flonase and gently perform Valsalva.) NECK: supple w/o LA CV: rrr. PULM: ctab, no inc wob ABD: soft, +bs EXT: no edema SKIN: no acute rash

## 2020-09-17 NOTE — Patient Instructions (Addendum)
Let me know if you need a referral to the GI clinic about a follow up colonoscopy.  I would get a flu shot when you are feeling better.   I would continue as is for now.  Recheck labs in about 11/2020.   Update me as needed.  Take care.  Glad to see you.

## 2020-09-19 NOTE — Assessment & Plan Note (Signed)
Living will discussed with patient. If patient were incapacitated then would have wife designated.

## 2020-09-19 NOTE — Assessment & Plan Note (Addendum)
Tetanus 2018 PNA d/w pt.   Shingles d/w pt.   Flu to be done 2021 when feeling better from covid.   covid vaccine 2021.   Colonoscopy 2015. he'll call about follow up vs update me if referral needed.   Prostate cancer screening and PSA options (with potential risks and benefits of testing vs not testing) were discussed along with recent recs/guidelines.  He declined testing PSA at this point. Diet and exercise discussed with patient.  HIV screening previously done per patient report. HCV screening prev done per patient report.  Living will discussed with patient. If patient were incapacitated then would have wife designated.  We talked about rechecking his lipids and glucose in 11/2020.

## 2020-09-19 NOTE — Assessment & Plan Note (Signed)
  He had covid.  In the meantime, he has noted less response to advair.  He had used SABA more with covid, less in the meantime, down to 1-2x/week.  Covid d/w pt.  He had Ab infusion.  Overall rare wheeze.  I would continue as is with Advair and as needed albuterol.  I would expect his response to Advair to gradually improve as he continues to improve.

## 2020-09-19 NOTE — Assessment & Plan Note (Signed)
He is working variable hours, on demand scheduling.  It affects his sleep cycle and he is trying to manage this in the meantime.

## 2020-12-20 ENCOUNTER — Other Ambulatory Visit: Payer: BC Managed Care – PPO

## 2020-12-28 ENCOUNTER — Ambulatory Visit: Payer: BC Managed Care – PPO | Admitting: Family Medicine

## 2021-01-23 DIAGNOSIS — S61219A Laceration without foreign body of unspecified finger without damage to nail, initial encounter: Secondary | ICD-10-CM | POA: Diagnosis not present

## 2021-01-30 DIAGNOSIS — Z4802 Encounter for removal of sutures: Secondary | ICD-10-CM | POA: Diagnosis not present

## 2021-03-25 ENCOUNTER — Other Ambulatory Visit: Payer: Self-pay | Admitting: Family Medicine

## 2021-07-21 ENCOUNTER — Other Ambulatory Visit: Payer: Self-pay | Admitting: Family Medicine

## 2021-07-21 DIAGNOSIS — E786 Lipoprotein deficiency: Secondary | ICD-10-CM

## 2021-07-30 ENCOUNTER — Other Ambulatory Visit: Payer: Self-pay

## 2021-07-30 ENCOUNTER — Other Ambulatory Visit (INDEPENDENT_AMBULATORY_CARE_PROVIDER_SITE_OTHER): Payer: BC Managed Care – PPO

## 2021-07-30 DIAGNOSIS — E786 Lipoprotein deficiency: Secondary | ICD-10-CM | POA: Diagnosis not present

## 2021-07-30 LAB — LIPID PANEL
Cholesterol: 144 mg/dL (ref 0–200)
HDL: 32.2 mg/dL — ABNORMAL LOW (ref >39.00)
LDL Cholesterol: 91 mg/dL (ref 0–99)
NonHDL: 111.49
Total CHOL/HDL Ratio: 4
Triglycerides: 100 mg/dL (ref 0.0–149.0)
VLDL: 20 mg/dL (ref 0.0–40.0)

## 2021-07-30 LAB — COMPREHENSIVE METABOLIC PANEL
ALT: 46 U/L (ref 0–53)
AST: 23 U/L (ref 0–37)
Albumin: 4.3 g/dL (ref 3.5–5.2)
Alkaline Phosphatase: 51 U/L (ref 39–117)
BUN: 12 mg/dL (ref 6–23)
CO2: 31 mEq/L (ref 19–32)
Calcium: 9.5 mg/dL (ref 8.4–10.5)
Chloride: 100 mEq/L (ref 96–112)
Creatinine, Ser: 0.77 mg/dL (ref 0.40–1.50)
GFR: 95.34 mL/min (ref >60.00)
Glucose, Bld: 106 mg/dL — ABNORMAL HIGH (ref 70–99)
Potassium: 4 mEq/L (ref 3.5–5.1)
Sodium: 140 mEq/L (ref 135–145)
Total Bilirubin: 0.4 mg/dL (ref 0.2–1.2)
Total Protein: 7.4 g/dL (ref 6.0–8.3)

## 2021-08-02 ENCOUNTER — Encounter: Payer: BC Managed Care – PPO | Admitting: Family Medicine

## 2021-08-23 ENCOUNTER — Other Ambulatory Visit: Payer: Self-pay

## 2021-08-23 ENCOUNTER — Encounter: Payer: Self-pay | Admitting: Family Medicine

## 2021-08-23 ENCOUNTER — Ambulatory Visit (INDEPENDENT_AMBULATORY_CARE_PROVIDER_SITE_OTHER): Payer: BC Managed Care – PPO | Admitting: Family Medicine

## 2021-08-23 VITALS — BP 124/82 | HR 74 | Temp 98.0°F | Ht 71.0 in | Wt 289.0 lb

## 2021-08-23 DIAGNOSIS — Z7189 Other specified counseling: Secondary | ICD-10-CM

## 2021-08-23 DIAGNOSIS — Z23 Encounter for immunization: Secondary | ICD-10-CM

## 2021-08-23 DIAGNOSIS — R5383 Other fatigue: Secondary | ICD-10-CM

## 2021-08-23 DIAGNOSIS — Z0000 Encounter for general adult medical examination without abnormal findings: Secondary | ICD-10-CM

## 2021-08-23 DIAGNOSIS — J449 Chronic obstructive pulmonary disease, unspecified: Secondary | ICD-10-CM

## 2021-08-23 LAB — CBC WITH DIFFERENTIAL/PLATELET
Basophils Absolute: 0 10*3/uL (ref 0.0–0.1)
Basophils Relative: 0.8 % (ref 0.0–3.0)
Eosinophils Absolute: 0.2 10*3/uL (ref 0.0–0.7)
Eosinophils Relative: 3.6 % (ref 0.0–5.0)
HCT: 46.6 % (ref 39.0–52.0)
Hemoglobin: 15.1 g/dL (ref 13.0–17.0)
Lymphocytes Relative: 28.2 % (ref 12.0–46.0)
Lymphs Abs: 1.8 10*3/uL (ref 0.7–4.0)
MCHC: 32.4 g/dL (ref 30.0–36.0)
MCV: 90.8 fl (ref 78.0–100.0)
Monocytes Absolute: 0.5 10*3/uL (ref 0.1–1.0)
Monocytes Relative: 7.9 % (ref 3.0–12.0)
Neutro Abs: 3.8 10*3/uL (ref 1.4–7.7)
Neutrophils Relative %: 59.5 % (ref 43.0–77.0)
Platelets: 207 10*3/uL (ref 150.0–400.0)
RBC: 5.13 Mil/uL (ref 4.22–5.81)
RDW: 14.9 % (ref 11.5–15.5)
WBC: 6.3 10*3/uL (ref 4.0–10.5)

## 2021-08-23 LAB — TSH: TSH: 1.55 u[IU]/mL (ref 0.35–5.50)

## 2021-08-23 MED ORDER — ALBUTEROL SULFATE HFA 108 (90 BASE) MCG/ACT IN AERS
INHALATION_SPRAY | RESPIRATORY_TRACT | 5 refills | Status: DC
Start: 2021-08-23 — End: 2022-12-18

## 2021-08-23 MED ORDER — FLUTICASONE-SALMETEROL 250-50 MCG/ACT IN AEPB: 1.0000 | INHALATION_SPRAY | Freq: Two times a day (BID) | RESPIRATORY_TRACT | 12 refills | Status: DC

## 2021-08-23 MED ORDER — FLUTICASONE PROPIONATE 50 MCG/ACT NA SUSP: 2.0000 | Freq: Every day | NASAL | 6 refills | Status: DC | PRN

## 2021-08-23 NOTE — Progress Notes (Signed)
This visit occurred during the SARS-CoV-2 public health emergency.  Safety protocols were in place, including screening questions prior to the visit, additional usage of staff PPE, and extensive cleaning of exam room while observing appropriate contact time as indicated for disinfecting solutions.  CPE- See plan.  Routine anticipatory guidance given to patient.  See health maintenance.  The possibility exists that previously documented standard health maintenance information may have been brought forward from a previous encounter into this note.  If needed, that same information has been updated to reflect the current situation based on today's encounter.    Tetanus 2018 PNA d/w pt.   Shingles d/w pt.   Flu to be done 2022 covid vaccine 2021.  d/w pt about booster.   Colonoscopy 2015.  he'll call about follow up vs update me if referral needed.   Prostate cancer screening and PSA options (with potential risks and benefits of testing vs not testing) were discussed along with recent recs/guidelines.  He declined testing PSA at this point. Diet and exercise discussed with patient.  HIV screening previously done per patient report. HCV screening prev done per patient report.   Living will discussed with patient. If patient were incapacitated then would have wife designated.  D/w pt about lung cancer screening.  He'll consider and let me know if he wants the referral.     COPD.  rare wheeze. Rare SABA.  Minimal cough, rare sputum.  Compliant with advair, rinses after use.    He is working variable hours, on demand scheduling.  It affects his sleep cycle and fatigue noted.  We talked about his work schedule, see letter for patient.   PMH and SH reviewed  Meds, vitals, and allergies reviewed.   ROS: Per HPI.  Unless specifically indicated otherwise in HPI, the patient denies:  General: fever. Eyes: acute vision changes ENT: sore throat Cardiovascular: chest pain Respiratory: SOB GI:  vomiting GU: dysuria Musculoskeletal: acute back pain Derm: acute rash Neuro: acute motor dysfunction Psych: worsening mood Endocrine: polydipsia Heme: bleeding Allergy: hayfever  GEN: nad, alert and oriented HEENT: ncat NECK: supple w/o LA CV: rrr. PULM: ctab, no inc wob ABD: soft, +bs EXT: no edema SKIN: no acute rash

## 2021-08-23 NOTE — Patient Instructions (Addendum)
Let me know if you want a referral for lung cancer screening.   Take care.  Glad to see you. Go to the lab on the way out.   If you have mychart we'll likely use that to update you.

## 2021-08-25 NOTE — Assessment & Plan Note (Signed)
rare wheeze. Rare SABA.  Minimal cough, rare sputum.  Compliant with advair, rinses after use.   Lungs are clear.  Continue albuterol as needed along with twice daily Advair at baseline, rinsing after use.

## 2021-08-25 NOTE — Assessment & Plan Note (Signed)
Living will discussed with patient. If patient were incapacitated then would have wife designated.

## 2021-08-25 NOTE — Assessment & Plan Note (Signed)
Letter given to patient regarding his work schedule.  It is appropriate for him to work a regular schedule to see if that will help with his fatigue.  See notes on labs to exclude other causes for fatigue.

## 2021-08-25 NOTE — Assessment & Plan Note (Signed)
Tetanus 2018 PNA d/w pt.   Shingles d/w pt.   Flu to be done 2022 covid vaccine 2021.  d/w pt about booster.   Colonoscopy 2015.  he'll call about follow up vs update me if referral needed.   Prostate cancer screening and PSA options (with potential risks and benefits of testing vs not testing) were discussed along with recent recs/guidelines.  He declined testing PSA at this point. Diet and exercise discussed with patient.  HIV screening previously done per patient report. HCV screening prev done per patient report.   Living will discussed with patient. If patient were incapacitated then would have wife designated.  D/w pt about lung cancer screening.  He'll consider and let me know if he wants the referral.

## 2021-08-30 ENCOUNTER — Telehealth: Payer: Self-pay | Admitting: Family Medicine

## 2021-08-30 DIAGNOSIS — Z122 Encounter for screening for malignant neoplasm of respiratory organs: Secondary | ICD-10-CM

## 2021-08-30 NOTE — Telephone Encounter (Signed)
Pt called stating that Dr Para March wanted him to have a cancer screening. Pt states that he would like to have that set up for him, and if he had to go to orientation, if he would like an evening appt.

## 2021-09-01 NOTE — Addendum Note (Signed)
Addended by: Joaquim Nam on: 09/01/2021 06:59 PM   Modules accepted: Orders

## 2021-09-01 NOTE — Telephone Encounter (Signed)
Please let him know I will put in the referral and he should get a phone call about that.  Thanks.

## 2021-09-03 NOTE — Telephone Encounter (Signed)
Patient has been notified that lung cancer screening has been ordered.

## 2021-09-10 ENCOUNTER — Other Ambulatory Visit: Payer: BC Managed Care – PPO

## 2021-09-15 IMAGING — CR DG CHEST 2V
1 series · 1 of 1 positions shown · non-contrast
Comparison: March 01, 2018
COMPARISON: March 01, 2018

Addendum:
CLINICAL DATA: Cough and shortness of breath.  [id number] positive

EXAM:
CHEST - 2 VIEW

[chest pa]
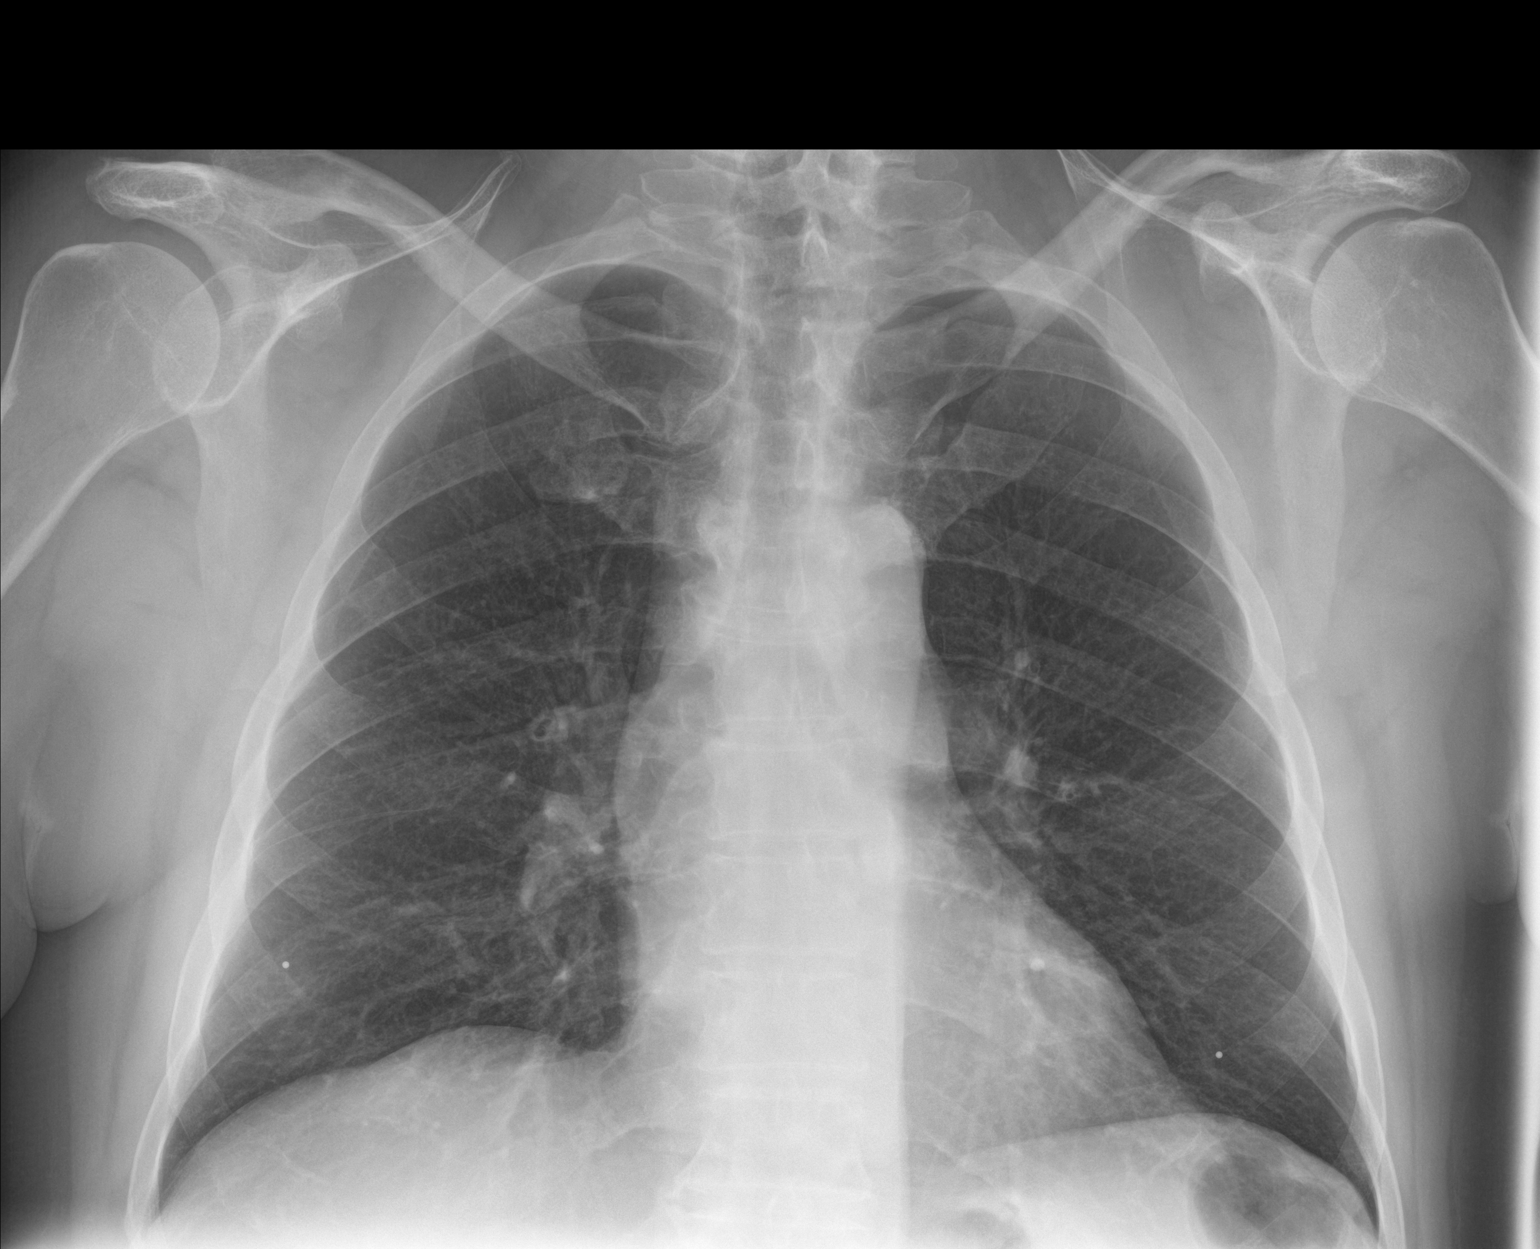

[1 of 1 positions shown; findings below may reference images not displayed]

FINDINGS: There is a suspected nipple shadow on the right. No edema or
airspace opacity. Heart size and pulmonary vascularity are normal.
No adenopathy. No bone lesions.
IMPRESSION: Suspected nipple shadow on the right; repeat study with nipple
markers could confirm. Lungs elsewhere clear. Heart size normal.

ADDENDUM:
A repeat study with nipple markers was obtained. The area questioned
earlier in the day represents a nipple shadow. Lungs clear.

*** End of Addendum ***
FINDINGS: There is a suspected nipple shadow on the right. No edema or
airspace opacity. Heart size and pulmonary vascularity are normal.
No adenopathy. No bone lesions.
IMPRESSION: Suspected nipple shadow on the right; repeat study with nipple
markers could confirm. Lungs elsewhere clear. Heart size normal.

## 2021-09-19 ENCOUNTER — Encounter: Payer: BC Managed Care – PPO | Admitting: Family Medicine

## 2021-10-07 NOTE — Telephone Encounter (Signed)
Pt called stating that no one has called him about his lung screening. Please advise.

## 2021-10-08 NOTE — Telephone Encounter (Signed)
I have sent a message to Pulmonary scheduling to see if we can get the pt scheduled.  ELEA

## 2021-10-18 ENCOUNTER — Other Ambulatory Visit: Payer: Self-pay

## 2021-10-18 DIAGNOSIS — Z87891 Personal history of nicotine dependence: Secondary | ICD-10-CM

## 2021-11-11 ENCOUNTER — Ambulatory Visit (INDEPENDENT_AMBULATORY_CARE_PROVIDER_SITE_OTHER): Payer: BC Managed Care – PPO | Admitting: Acute Care

## 2021-11-11 ENCOUNTER — Other Ambulatory Visit: Payer: Self-pay

## 2021-11-11 ENCOUNTER — Encounter: Payer: Self-pay | Admitting: Acute Care

## 2021-11-11 DIAGNOSIS — Z87891 Personal history of nicotine dependence: Secondary | ICD-10-CM | POA: Diagnosis not present

## 2021-11-11 NOTE — Progress Notes (Addendum)
Virtual Visit via Telephone Note  I connected with Jill Poling on 11/13/21 at  4:30 PM EST by telephone and verified that I am speaking with the correct person using two identifiers.  Location: Patient: At home Provider: [address] [place], [place], [address]    I discussed the limitations, risks, security and privacy concerns of performing an evaluation and management service by telephone and the availability of in person appointments. I also discussed with the patient that there may be a patient responsible charge related to this service. The patient expressed understanding and agreed to proceed.    Shared Decision Making Visit Lung Cancer Screening Program [phone])   Eligibility: Age 65 y.o. Pack Years Smoking History Calculation 39 pack year smoking history (# packs/per year x # years smoked) Recent History of coughing up blood  no Unexplained weight loss? no ( >Than 15 pounds within the last 6 months ) Prior History Lung / other cancer no (Diagnosis within the last 5 years already requiring surveillance chest CT Scans). Smoking Status Former Smoker Former Smokers: Years since quit: 10 years  Quit Date: 2012  Visit Components: Discussion included one or more decision making aids. Yes Discussion included risk/benefits of screening. yes Discussion included potential follow up diagnostic testing for abnormal scans. Yes Discussion included meaning and risk of over diagnosis. Yes Discussion included meaning and risk of False Positives. yes Discussion included meaning of total radiation exposure. yes  Counseling Included: Importance of adherence to annual lung cancer LDCT screening. yes Impact of comorbidities on ability to participate in the program. yes Ability and willingness to under diagnostic treatment. yes  Smoking Cessation Counseling: Current Smokers:  Discussed importance of smoking cessation. yes Information about tobacco cessation classes and  interventions provided to patient. yes Patient provided with "ticket" for LDCT Scan. yes Symptomatic Patient. no  Counseling Diagnosis Code: Tobacco Use Z72.0 Asymptomatic Patient yes  Counseling (Intermediate counseling: > three minutes counseling) V4008 Former Smokers:  Discussed the importance of maintaining cigarette abstinence. yes Diagnosis Code: Personal History of Nicotine Dependence. Q76.195 Information about tobacco cessation classes and interventions provided to patient. Yes Patient provided with "ticket" for LDCT Scan. yes Written Order for Lung Cancer Screening with LDCT placed in Epic. Yes (CT Chest Lung Cancer Screening Low Dose W/O CM) [id number] Z12.2-Screening of respiratory organs Z87.891-Personal history of nicotine dependence  I spent 25 minutes of face to face time/virtual visit time  with Mr. Raygoza discussing the risks and benefits of lung cancer screening. We took the time to pause the power point at intervals to allow for questions to be asked and answered to ensure understanding. We discussed that he had taken the single most powerful action possible to decrease his risk of developing lung cancer when he quit smoking. I counseled him to remain smoke free, and to contact me if he ever had the desire to smoke again so that I can provide resources and tools to help support the effort to remain smoke free. We discussed the time and location of the scan, and that either  Abigail Miyamoto RN, Karlton Lemon, RN or I  or I will call / send a letter with the results within  24-72 hours of receiving them. He has the office contact information in the event he needs to speak with me,  he verbalized understanding of all of the above and had no further questions upon leaving the office.     I explained to the patient that there has been a  high incidence of coronary artery disease noted on these exams. I explained that this is a non-gated exam therefore degree or severity cannot be  determined. This patient is not on statin therapy. I have asked the patient to follow-up with their PCP regarding any incidental finding of coronary artery disease and management with diet or medication as they feel is clinically indicated. The patient verbalized understanding of the above and had no further questions.     Bevelyn Ngo, NP 11/11/2021

## 2021-11-11 NOTE — Patient Instructions (Signed)
Thank you for participating in the [facility]. °It was our pleasure to meet you today. °We will call you with the results of your scan within the next few days. °Your scan will be assigned a Lung RADS category score by the physicians reading the scans.  °This Lung RADS score determines follow up scanning.  °See below for description of categories, and follow up screening recommendations. °We will be in touch to schedule your follow up screening annually or based on recommendations of our providers. °We will fax a copy of your scan results to your Primary Care Physician, or the physician who referred you to the program, to ensure they have the results. °Please call the office if you have any questions or concerns regarding your scanning experience or results.  °Our office number is [phone]. °Please speak with Denise Phelps, RN. She is our Lung Cancer Screening RN. °If she is unavailable when you call, please have the office staff send her a message. She will return your call at her earliest convenience. °Remember, if your scan is normal, we will scan you annually as long as you continue to meet the criteria for the program. (Age 55-77, Current smoker or smoker who has quit within the last 15 years). °If you are a smoker, remember, quitting is the single most powerful action that you can take to decrease your risk of lung cancer and other pulmonary, breathing related problems. °We know quitting is hard, and we are here to help.  °Please let us know if there is anything we can do to help you meet your goal of quitting. °If you are a former smoker, congratulations. We are proud of you! Remain smoke free! °Remember you can refer friends or family members through the number above.  °We will screen them to make sure they meet criteria for the program. °Thank you for helping us take better care of you by participating in Lung Screening. ° °You can receive free nicotine replacement therapy  ( patches, gum or mints) by calling 1-800-QUIT NOW. Please call so we can get you on the path to becoming  a non-smoker. I know it is hard, but you can do this! ° °Lung RADS Categories: ° °Lung RADS 1: no nodules or definitely non-concerning nodules.  °Recommendation is for a repeat annual scan in 12 months. ° °Lung RADS 2:  nodules that are non-concerning in appearance and behavior with a very low likelihood of becoming an active cancer. °Recommendation is for a repeat annual scan in 12 months. ° °Lung RADS 3: nodules that are probably non-concerning , includes nodules with a low likelihood of becoming an active cancer.  Recommendation is for a 6-month repeat screening scan. Often noted after an upper respiratory illness. We will be in touch to make sure you have no questions, and to schedule your 6-month scan. ° °Lung RADS 4 A: nodules with concerning findings, recommendation is most often for a follow up scan in 3 months or additional testing based on our provider's assessment of the scan. We will be in touch to make sure you have no questions and to schedule the recommended 3 month follow up scan. ° °Lung RADS 4 B:  indicates findings that are concerning. We will be in touch with you to schedule additional diagnostic testing based on our provider's  assessment of the scan. ° °Hypnosis for smoking cessation  °[employer] °[phone] ° °Acupuncture for smoking cessation  °[facility] °[phone]  °

## 2021-11-12 ENCOUNTER — Ambulatory Visit
Admission: RE | Admit: 2021-11-12 | Discharge: 2021-11-12 | Disposition: A | Discharge status: Home / Self Care | Payer: BC Managed Care – PPO | Source: Ambulatory Visit | Attending: Acute Care | Admitting: Acute Care

## 2021-11-12 DIAGNOSIS — Z87891 Personal history of nicotine dependence: Secondary | ICD-10-CM

## 2021-11-14 ENCOUNTER — Other Ambulatory Visit: Payer: Self-pay | Admitting: Acute Care

## 2021-11-14 DIAGNOSIS — Z87891 Personal history of nicotine dependence: Secondary | ICD-10-CM

## 2021-11-19 ENCOUNTER — Telehealth: Payer: Self-pay | Admitting: Family Medicine

## 2021-11-19 NOTE — Telephone Encounter (Signed)
Please call patient.  He did not have any alarming findings in his lungs on the CT scan, and that is good news.  It is reasonable to recheck it in 1 year.  He should get a phone call about that next year.  He does have incidentally noted heart artery calcifications.  It is reasonable to start cholesterol medication to try to decrease his risk of a heart attack or stroke.  This would be the case even though his cholesterol is not significantly elevated.  Please offer an office visit so we can discuss.  If he is having any chest pain let me know so we can set him up with cardiology.  Please give him ER cautions regarding chest pain.  Thanks.

## 2021-11-19 NOTE — Telephone Encounter (Signed)
Patient notified of lab results and verbalized understanding. Patient scheduled appt to discuss on 11/28/21 at 4:00 pm

## 2021-11-28 ENCOUNTER — Ambulatory Visit: Payer: BC Managed Care – PPO | Admitting: Family Medicine

## 2021-11-28 ENCOUNTER — Other Ambulatory Visit: Payer: Self-pay

## 2021-11-28 ENCOUNTER — Encounter: Payer: Self-pay | Admitting: Family Medicine

## 2021-11-28 VITALS — BP 118/72 | Temp 97.9°F | Wt 292.0 lb

## 2021-11-28 DIAGNOSIS — I2510 Atherosclerotic heart disease of native coronary artery without angina pectoris: Secondary | ICD-10-CM

## 2021-11-28 NOTE — Progress Notes (Signed)
This visit occurred during the SARS-CoV-2 public health emergency.  Safety protocols were in place, including screening questions prior to the visit, additional usage of staff PPE, and extensive cleaning of exam room while observing appropriate contact time as indicated for disinfecting solutions.  Coronary calcifications previously noted on imaging. We talked about prev CT and options/rationale for evaluation. Statin use Cards eval CT calcium scoring  He opts for Calcium scoring.  He would prefer this be done in the afternoons.  D/w pt about potential statin use.  No CP, no exertional sx.    Letter done for work.  He needed a new letter.  He felt better on the regular work schedule.    He was alternating OTC tylenol and ibuprofen for his knee pain.  He can get by doing that  Meds, vitals, and allergies reviewed.   ROS: Per HPI unless specifically indicated in ROS section   GEN: nad, alert and oriented HEENT: ncat NECK: supple w/o LA CV: rrr.  PULM: ctab, no inc wob ABD: soft, +bs EXT: no edema SKIN: Well-perfused.

## 2021-11-28 NOTE — Patient Instructions (Signed)
Let me work on getting the follow up cardiac scan and we'll go from there.  Take care.  Glad to see you.

## 2021-12-01 DIAGNOSIS — I2510 Atherosclerotic heart disease of native coronary artery without angina pectoris: Secondary | ICD-10-CM | POA: Insufficient documentation

## 2021-12-01 NOTE — Assessment & Plan Note (Signed)
Reasonable to get coronary calcium scoring done.  We can address intervention after we get more data.  Still okay for outpatient follow-up.  He agrees with plan.  30 minutes were devoted to patient care in this encounter (this includes time spent reviewing the patient's file/history, interviewing and examining the patient, counseling/reviewing plan with patient).

## 2022-01-02 ENCOUNTER — Other Ambulatory Visit: Payer: Self-pay

## 2022-01-02 ENCOUNTER — Ambulatory Visit (INDEPENDENT_AMBULATORY_CARE_PROVIDER_SITE_OTHER)
Admission: RE | Admit: 2022-01-02 | Discharge: 2022-01-02 | Disposition: A | Discharge status: Home / Self Care | Payer: Self-pay | Source: Ambulatory Visit | Attending: Family Medicine | Admitting: Family Medicine

## 2022-01-02 DIAGNOSIS — I2510 Atherosclerotic heart disease of native coronary artery without angina pectoris: Secondary | ICD-10-CM

## 2022-01-10 ENCOUNTER — Telehealth: Payer: Self-pay | Admitting: Family Medicine

## 2022-01-10 DIAGNOSIS — I2510 Atherosclerotic heart disease of native coronary artery without angina pectoris: Secondary | ICD-10-CM

## 2022-01-10 MED ORDER — ATORVASTATIN CALCIUM 20 MG PO TABS: 20.0000 mg | ORAL_TABLET | Freq: Every day | ORAL | 3 refills | Status: DC

## 2022-01-10 NOTE — Addendum Note (Signed)
Addended by: Joaquim Nam on: 01/10/2022 03:28 PM   Modules accepted: Orders

## 2022-01-10 NOTE — Telephone Encounter (Signed)
I put in the referral, I sent the prescription for atorvastatin, and I put in the follow-up lab orders.  He should get a call about the cardiology referral.  If he notices muscle aches on atorvastatin then cut back to 1/2 tablet and let me know.  If he can tolerate the medication then he needs follow-up labs in 2 months.  Please set up a follow-up fasting lab visit.  Thanks.

## 2022-01-10 NOTE — Telephone Encounter (Signed)
Patient aware rx was sent. Cardio has already been set up for 02/03/22 and patient scheduled CPE he needs done for work on 03/20/22 and will do labs then

## 2022-01-10 NOTE — Telephone Encounter (Signed)
Mr. Eschbach called in and stated that Shanda Bumps called him Tuesday and discussed about getting medicine for her the palque for her his heart and he was to think on it. He stated that he wants to try the medication.

## 2022-01-31 NOTE — Progress Notes (Signed)
?Cardiology Office Note:   ? ?Date:  02/03/2022  ? ?ID:  Juan Knight, DOB 05/25/1956, MRN [id number] ? ?PCP:  Tonia Ghent, MD  ? ?[facility] Prov[facility] ?Cardiologist:  Lenna Sciara, MD ?Referring MD: Tonia Ghent, MD  ? ?Chief Complaint/Reason for Referral: Coronary artery seen and aortic atherosclerosis ? ?ASSESSMENT:   ? ?Coronary artery calcification seen on CAT scan ? ?Aortic atherosclerosis ([place]) ? ?Chronic obstructive pulmonary disease, unspecified COPD type ([place]) ? ?Hyperlipidemia, unspecified hyperlipidemia type ? ?BMI 40.0-44.9, adult ([place]) ? ?Elevated blood pressure reading ? ?PLAN:   ? ?In order of problems listed above: ? ?1.  Start aspirin 81 mg and continue atorvastatin.  Goal LDL is less than 70 and as low as possible.  Will increase atorvastatin to 40 mg.  We will check lipid panel, LFTs, and lipoprotein (a) in 2 months.  Will obtain echocardiogram.  Follow-up in 6 months.   ?2.  Aspirin, atorvastatin with goal LDL less than 70, and strict blood pressure control. ?3.  Continue current therapy. ?4.  Goal LDL is less than 70 and as low as possible as detailed above.  We will check lipid panel and LFTs in 2 months. ?5.  Encouraged exercise and dietary modifications.  Could consider pharmacy referral in the future. ?6.  I repeated his blood pressure with the appropriately sized cuff and it was 140/80 mmHg.  His goal blood pressure is less than 130/80 mmHg.  Next time he visit with a provider if he is not at this goal and I antihypertensive should be considered. ?     ? ?  ? ?Dispo:  Return in about 6 months (around 08/06/2022).  ?  ? ?Medication Adjustments/Labs and Tests Ordered: ?Current medicines are reviewed at length with the patient today.  Concerns regarding medicines are outlined above.  ? ?Tests Ordered: ?No orders of the defined types were placed in this encounter. ? ? ?Medication Changes: ?No orders of the defined types were placed in this encounter. ? ? ?History of Present  Illness:   ? ?FOCUSED PROBLEM LIST:   ?1.  Coronary artery calcium and aortic atherosclerosis on CT scan February 2023 ?2.  COPD with remote tobacco abuse ? ? ?The patient is a 66 y.o. male with the indicated medical history here for recommendations regarding elevated calcium score and atherosclerosis of the aorta seen on calcium score CT scan.  This was ordered due to coronary calcifications previously noted on other imaging.  When he discussed this with his PCP he reported no exertional chest pain or dyspnea.   He works full-time.  He has had no issues or any cardiovascular complaints.  He tells me his blood pressure is usually pretty well controlled at home or at his providers offices.  He is required no hospitalizations or emergency room visits.  He fortunately continues to abstain from smoking.  He occasionally wheezes but his inhalers typically controlled this for the most part.  He has had no severe bleeding or bruising, signs or symptoms stroke, snoring, paroxysmal nocturnal dyspnea, orthopnea.  He is otherwise well and without significant complaints. ?  ?Previous Medical History: ?Past Medical History:  ?Diagnosis Date  ? COPD (chronic obstructive pulmonary disease) ([place])   ? OA (osteoarthritis)   ? occ knee pain  ? ? ? ?Current Medications: ?Current Meds  ?Medication Sig  ? albuterol (VENTOLIN HFA) 108 (90 Base) MCG/ACT inhaler INHALE 1 TO 2 PUFFS INTO THE LUNGS EVERY 6 HOURS AS NEEDED  ? atorvastatin (LIPITOR)  20 MG tablet Take 1 tablet (20 mg total) by mouth daily.  ? Cholecalciferol (VITAMIN D3) 5000 units CAPS Take by mouth daily.  ? fluticasone (FLONASE) 50 MCG/ACT nasal spray Place 2 sprays into both nostrils daily as needed for allergies or rhinitis.  ? fluticasone-salmeterol (ADVAIR DISKUS) 250-50 MCG/ACT AEPB Inhale 1 puff into the lungs in the morning and at bedtime. Rinse after use  ?  ? ?Allergies:    ?Patient has no known allergies.  ? ?Social History:   ?Social History  ? ?Tobacco Use  ?  Smoking status: Former  ?  Packs/day: 1.00  ?  Years: 40.00  ?  Pack years: 40.00  ?  Types: Cigarettes  ?  Quit date: 11/24/2010  ?  Years since quitting: 11.2  ? Smokeless tobacco: Never  ?Substance Use Topics  ? Alcohol use: Yes  ?  Comment: Six pack per week  ? Drug use: No  ?  ? ?Family Hx: ?Family History  ?Problem Relation Age of Onset  ? COPD Mother   ? Arthritis Mother   ?     rheumatoid arthritis  ? Lupus Mother   ? Leukemia Father   ? Colon cancer Neg Hx   ? Prostate cancer Neg Hx   ?  ? ?Review of Systems:   ?Please see the history of present illness.    ?All other systems reviewed and are negative. ?  ? ? ?EKGs/Labs/Other Test Reviewed:   ? ?EKG: 2017, sinus rhythm; EKG today sinus rhythm with nonspecific ST and T wave changes in lead III and aVF ? ?Prior CV studies: ? ?Calcium score 2/23 ?Left main: 0 ?Left anterior descending artery: 300 ?Left circumflex artery: 65  ?Right coronary artery: 28 ?Total: 393  Percentile: 83rd ? ?IMPRESSION: ?1. Coronary calcium score of 393. This was 83rd percentile for age-, ?race-, and sex-matched controls. ?2. Aortic atherosclerosis. ? ?Imaging studies that I have independently reviewed today: CT ? ?Recent Labs: ?07/30/2021: ALT 46; BUN 12; Creatinine, Ser 0.77; Potassium 4.0; Sodium 140 ?08/23/2021: Hemoglobin 15.1; Platelets 207.0; TSH 1.55  ? ?Recent Lipid Panel ?Lab Results  ?Component Value Date/Time  ? CHOL 144 07/30/2021 07:52 AM  ? TRIG 100.0 07/30/2021 07:52 AM  ? HDL 32.20 (L) 07/30/2021 07:52 AM  ? LDLCALC 91 07/30/2021 07:52 AM  ? ? ?Risk Assessment/Calculations:   ? ? ?    ? ?Physical Exam:   ? ?VS:  BP (!) 154/88   Pulse 74   Ht 5\' 11"  (1.803 m)   Wt 289 lb (131.1 kg)   BMI 40.31 kg/m?    ?Wt Readings from Last 3 Encounters:  ?02/03/22 289 lb (131.1 kg)  ?11/28/21 292 lb (132.5 kg)  ?08/23/21 289 lb (131.1 kg)  ?  ?GENERAL:  No apparent distress, AOx3 ?HEENT:  No carotid bruits, +2 carotid impulses, no scleral icterus ?CAR: RRR no murmurs, gallops, rubs,  or thrills ?RES:  Clear to auscultation bilaterally ?ABD:  Soft, nontender, nondistended, positive bowel sounds x 4 ?VASC:  +2 radial pulses, +2 carotid pulses, palpable pedal pulses ?NEURO:  CN 2-12 grossly intact; motor and sensory grossly intact ?PSYCH:  No active depression or anxiety ?EXT:  No edema, ecchymosis, or cyanosis ? ?Signed, ?Early Osmond, MD  ?02/03/2022 4:20 PM    ?[facility] ?[address], [place], [place]  [address] ?Phone: [phone]; Fax: [phone]  ? ?Note:  This document was prepared using Dragon voice recognition software and may include unintentional dictation  errors. ?

## 2022-02-03 ENCOUNTER — Other Ambulatory Visit: Payer: Self-pay

## 2022-02-03 ENCOUNTER — Encounter: Payer: Self-pay | Admitting: Internal Medicine

## 2022-02-03 ENCOUNTER — Ambulatory Visit: Payer: BC Managed Care – PPO | Admitting: Internal Medicine

## 2022-02-03 VITALS — BP 154/88 | HR 74 | Ht 71.0 in | Wt 289.0 lb

## 2022-02-03 DIAGNOSIS — E785 Hyperlipidemia, unspecified: Secondary | ICD-10-CM

## 2022-02-03 DIAGNOSIS — I2510 Atherosclerotic heart disease of native coronary artery without angina pectoris: Secondary | ICD-10-CM | POA: Diagnosis not present

## 2022-02-03 DIAGNOSIS — I700 Atherosclerosis of aorta: Secondary | ICD-10-CM | POA: Diagnosis not present

## 2022-02-03 DIAGNOSIS — Z6841 Body Mass Index (BMI) 40.0 and over, adult: Secondary | ICD-10-CM

## 2022-02-03 DIAGNOSIS — J449 Chronic obstructive pulmonary disease, unspecified: Secondary | ICD-10-CM

## 2022-02-03 DIAGNOSIS — R030 Elevated blood-pressure reading, without diagnosis of hypertension: Secondary | ICD-10-CM

## 2022-02-03 MED ORDER — ASPIRIN EC 81 MG PO TBEC: 81.0000 mg | DELAYED_RELEASE_TABLET | Freq: Every day | ORAL | 3 refills | Status: AC

## 2022-02-03 MED ORDER — ATORVASTATIN CALCIUM 40 MG PO TABS: 40.0000 mg | ORAL_TABLET | Freq: Every day | ORAL | 3 refills | Status: DC

## 2022-02-03 NOTE — Patient Instructions (Signed)
Medication Instructions:  ?START Aspirin 81mg  daily ?INCREASE Atorvastatin (lipitor) 40mg  daily ?*If you need a refill on your cardiac medications before your next appointment, please call your pharmacy* ? ? ?Lab Work: ?LIPIDS, LFT, Lipoprotein(a) in 2 months ?If you have labs (blood work) drawn today and your tests are completely normal, you will receive your results only by: ?MyChart Message (if you have MyChart) OR ?A paper copy in the mail ?If you have any lab test that is abnormal or we need to change your treatment, we will call you to review the results. ? ? ?Testing/Procedures: ?ECHO ?Your physician has requested that you have an echocardiogram. Echocardiography is a painless test that uses sound waves to create images of your heart. It provides your doctor with information about the size and shape of your heart and how well your heart?s chambers and valves are working. This procedure takes approximately one hour. There are no restrictions for this procedure. ? ?Follow-Up: ?At [facility], you and your health needs are our priority.  As part of our continuing mission to provide you with exceptional heart care, we have created designated Provider Care Teams.  These Care Teams include your primary Cardiologist (physician) and Advanced Practice Providers (APPs -  Physician Assistants and Nurse Practitioners) who all work together to provide you with the care you need, when you need it. ? ?Your next appointment:   ?6 month(s) ? ?The format for your next appointment:   ?In Person ? ?Provider:   ?Early Osmond, MD { ? ?  ?

## 2022-03-20 ENCOUNTER — Ambulatory Visit (INDEPENDENT_AMBULATORY_CARE_PROVIDER_SITE_OTHER): Payer: BC Managed Care – PPO | Admitting: Family Medicine

## 2022-03-20 ENCOUNTER — Encounter: Payer: Self-pay | Admitting: Family Medicine

## 2022-03-20 VITALS — BP 122/82 | HR 74 | Temp 97.7°F | Ht 71.0 in | Wt 289.0 lb

## 2022-03-20 DIAGNOSIS — M545 Low back pain, unspecified: Secondary | ICD-10-CM

## 2022-03-20 DIAGNOSIS — M722 Plantar fascial fibromatosis: Secondary | ICD-10-CM

## 2022-03-20 DIAGNOSIS — I2510 Atherosclerotic heart disease of native coronary artery without angina pectoris: Secondary | ICD-10-CM

## 2022-03-20 DIAGNOSIS — Z7189 Other specified counseling: Secondary | ICD-10-CM

## 2022-03-20 DIAGNOSIS — Z0000 Encounter for general adult medical examination without abnormal findings: Secondary | ICD-10-CM

## 2022-03-20 DIAGNOSIS — J449 Chronic obstructive pulmonary disease, unspecified: Secondary | ICD-10-CM

## 2022-03-20 LAB — COMPREHENSIVE METABOLIC PANEL
ALT: 32 U/L (ref 0–53)
AST: 25 U/L (ref 0–37)
Albumin: 4.6 g/dL (ref 3.5–5.2)
Alkaline Phosphatase: 57 U/L (ref 39–117)
BUN: 12 mg/dL (ref 6–23)
CO2: 31 mEq/L (ref 19–32)
Calcium: 9.4 mg/dL (ref 8.4–10.5)
Chloride: 102 mEq/L (ref 96–112)
Creatinine, Ser: 0.74 mg/dL (ref 0.40–1.50)
GFR: 96.06 mL/min (ref >60.00)
Glucose, Bld: 99 mg/dL (ref 70–99)
Potassium: 4.6 mEq/L (ref 3.5–5.1)
Sodium: 140 mEq/L (ref 135–145)
Total Bilirubin: 0.6 mg/dL (ref 0.2–1.2)
Total Protein: 7.4 g/dL (ref 6.0–8.3)

## 2022-03-20 LAB — LIPID PANEL
Cholesterol: 89 mg/dL (ref 0–200)
HDL: 32 mg/dL — ABNORMAL LOW (ref >39.00)
LDL Cholesterol: 44 mg/dL (ref 0–99)
NonHDL: 56.55
Total CHOL/HDL Ratio: 3
Triglycerides: 62 mg/dL (ref 0.0–149.0)
VLDL: 12.4 mg/dL (ref 0.0–40.0)

## 2022-03-20 NOTE — Patient Instructions (Addendum)
Check with your insurance to see if they will cover the shingles shot. ?Take care.  Glad to see you. ?Call about the colonoscopy.  Let me know if you can't get scheduled.   ?Go to the lab on the way out.   If you have mychart we'll likely use that to update you.    ?Try using the back exercises and see if that helps.  ?Stretch your feet before getting out of bed.   ?

## 2022-03-20 NOTE — Progress Notes (Signed)
CPE- See plan.  Routine anticipatory guidance given to patient.  See health maintenance.  The possibility exists that previously documented standard health maintenance information may have been brought forward from a previous encounter into this note.  If needed, that same information has been updated to reflect the current situation based on today's encounter.   ? ?Tetanus 2018 ?PNA d/w pt.   ?Shingles d/w pt.   ?Flu done 2022 ?covid vaccine 2021.  d/w pt about booster.   ?Colonoscopy 2015.  he'll call about follow up vs update me if referral needed.   ?Prostate cancer screening and PSA options (with potential risks and benefits of testing vs not testing) were discussed along with recent recs/guidelines.  He declined testing PSA at this point. ?Diet and exercise discussed with patient.  ?HIV screening previously done per patient report. ?HCV screening prev done per patient report.   ?Living will discussed with patient. If patient were incapacitated then would have his wife designated. ? ?He had prev lung cancer screening with CT, d/w pt.   ? ?CAD d/w pt.  On statin at baseline.  Echo pending.  Labs pending.  Occ foot pain in the AM, with 1st step in the AM.  Better after a few steps.  Pain along plantar fascia.  This does not appear to be statin related.  Discussed. ? ?Occ sciatica pain, can wake up at night from pain. Intermittent.  L sided.  Radiates down the thigh but not down to the foot.  Going on for about 1 month.  Getting up and moving helps.  Tylenol helps.   ? ?COPD.  On advair at baseline.  Using SABA prn.  Not SOB.  Not wheezing.  Not SOB.   ? ?PMH and SH reviewed ? ?Meds, vitals, and allergies reviewed.  ? ?ROS: Per HPI.  Unless specifically indicated otherwise in HPI, the patient denies: ? ?General: fever. ?Eyes: acute vision changes ?ENT: sore throat ?Cardiovascular: chest pain ?Respiratory: SOB ?GI: vomiting ?GU: dysuria ?Musculoskeletal: acute back pain ?Derm: acute rash ?Neuro: acute motor  dysfunction ?Psych: worsening mood ?Endocrine: polydipsia ?Heme: bleeding ?Allergy: hayfever ? ?GEN: nad, alert and oriented ?HEENT: ncat ?NECK: supple w/o LA ?CV: rrr. ?PULM: ctab, no inc wob ?ABD: soft, +bs ?EXT: no edema ?SKIN: no acute rash ?Strength and sensation normal for the lower extremities.  Back nontender to palpation midline. ?

## 2022-03-23 DIAGNOSIS — M545 Low back pain, unspecified: Secondary | ICD-10-CM | POA: Insufficient documentation

## 2022-03-23 DIAGNOSIS — M722 Plantar fascial fibromatosis: Secondary | ICD-10-CM | POA: Insufficient documentation

## 2022-03-23 NOTE — Assessment & Plan Note (Signed)
Living will discussed with patient. If patient were incapacitated then would have his wife designated. ?

## 2022-03-23 NOTE — Assessment & Plan Note (Signed)
Discussed anatomy and stretching prior to getting out of bed in the morning.  He can update me as needed. ?

## 2022-03-23 NOTE — Assessment & Plan Note (Signed)
CAD d/w pt.  On statin at baseline.  Echo pending.  Labs pending.   Continue atorvastatin.  See notes on labs. ?

## 2022-03-23 NOTE — Assessment & Plan Note (Addendum)
Discussed options.  Handout given regarding exercises for low back pain.  He can start with and then update me as needed.  Still okay for outpatient follow-up.  He can update me as needed. ?

## 2022-03-23 NOTE — Assessment & Plan Note (Signed)
Continue Advair with as needed albuterol.  Lungs are clear.  Okay for outpatient follow-up. ?

## 2022-03-23 NOTE — Assessment & Plan Note (Signed)
Tetanus 2018 ?PNA d/w pt.   ?Shingles d/w pt.   ?Flu done 2022 ?covid vaccine 2021.  d/w pt about booster.   ?Colonoscopy 2015.  he'll call about follow up vs update me if referral needed.   ?Prostate cancer screening and PSA options (with potential risks and benefits of testing vs not testing) were discussed along with recent recs/guidelines.  He declined testing PSA at this point. ?Diet and exercise discussed with patient.  ?HIV screening previously done per patient report. ?HCV screening prev done per patient report.   ?Living will discussed with patient. If patient were incapacitated then would have his wife designated. ?

## 2022-03-25 LAB — LIPOPROTEIN A (LPA): Lipoprotein (a): 51 nmol/L (ref <75)

## 2022-04-10 ENCOUNTER — Other Ambulatory Visit: Payer: BC Managed Care – PPO

## 2022-04-11 ENCOUNTER — Other Ambulatory Visit: Payer: BC Managed Care – PPO | Admitting: *Deleted

## 2022-04-11 ENCOUNTER — Ambulatory Visit (HOSPITAL_COMMUNITY): Payer: BC Managed Care – PPO | Attending: Cardiology

## 2022-04-11 DIAGNOSIS — I700 Atherosclerosis of aorta: Secondary | ICD-10-CM

## 2022-04-11 DIAGNOSIS — R030 Elevated blood-pressure reading, without diagnosis of hypertension: Secondary | ICD-10-CM

## 2022-04-11 DIAGNOSIS — I2510 Atherosclerotic heart disease of native coronary artery without angina pectoris: Secondary | ICD-10-CM

## 2022-04-11 DIAGNOSIS — J449 Chronic obstructive pulmonary disease, unspecified: Secondary | ICD-10-CM

## 2022-04-11 DIAGNOSIS — E785 Hyperlipidemia, unspecified: Secondary | ICD-10-CM

## 2022-04-11 DIAGNOSIS — Z6841 Body Mass Index (BMI) 40.0 and over, adult: Secondary | ICD-10-CM | POA: Diagnosis not present

## 2022-04-11 LAB — ECHOCARDIOGRAM COMPLETE
Area-P 1/2: 3.34 cm2
S' Lateral: 3.1 cm

## 2022-04-12 LAB — LIPID PANEL
Chol/HDL Ratio: 3.8 ratio (ref 0.0–5.0)
Cholesterol, Total: 96 mg/dL — ABNORMAL LOW (ref 100–199)
HDL: 25 mg/dL — ABNORMAL LOW (ref >39)
LDL Chol Calc (NIH): 50 mg/dL (ref 0–99)
Triglycerides: 115 mg/dL (ref 0–149)
VLDL Cholesterol Cal: 21 mg/dL (ref 5–40)

## 2022-04-12 LAB — HEPATIC FUNCTION PANEL
ALT: 44 IU/L (ref 0–44)
AST: 30 IU/L (ref 0–40)
Albumin: 4.8 g/dL (ref 3.8–4.8)
Alkaline Phosphatase: 61 IU/L (ref 44–121)
Bilirubin Total: 0.5 mg/dL (ref 0.0–1.2)
Bilirubin, Direct: 0.23 mg/dL (ref 0.00–0.40)
Total Protein: 7.6 g/dL (ref 6.0–8.5)

## 2022-04-12 LAB — LIPOPROTEIN A (LPA): Lipoprotein (a): 77.7 nmol/L — ABNORMAL HIGH (ref <75.0)

## 2022-08-12 NOTE — Progress Notes (Unsigned)
Cardiology Office Note:    Date:  08/14/2022   ID:  Juan Knight, DOB 09-Jul-1956, MRN [id number]  PCP:  Joaquim Nam, MD   [facility] Providers Cardiologist:  Alverda Skeans, MD Referring MD: Joaquim Nam, MD   Chief Complaint/Reason for Referral: Coronary artery seen and aortic atherosclerosis  ASSESSMENT:    Coronary artery calcification seen on CAT scan - Plan: Lipoprotein A (LPA), Comprehensive metabolic panel, CBC, VAS [place] AAA DUPLEX  Aortic atherosclerosis ([facility]) - Plan: Lipoprotein A (LPA), Comprehensive metabolic panel, CBC, VAS [place] AAA DUPLEX  Chronic obstructive pulmonary disease, unspecified COPD type ([facility]) - Plan: Lipoprotein A (LPA), Comprehensive metabolic panel, CBC, VAS [place] AAA DUPLEX  Hyperlipidemia LDL goal <70 - Plan: Lipoprotein A (LPA), Comprehensive metabolic panel, CBC, VAS [place] AAA DUPLEX  BMI 40.0-44.9, adult ([facility]) - Plan: Lipoprotein A (LPA), Comprehensive metabolic panel, CBC, VAS [place] AAA DUPLEX  Hypertension, unspecified type - Plan: Lipoprotein A (LPA), Comprehensive metabolic panel, CBC, VAS [place] AAA DUPLEX  Elevated lipoprotein(a) - Plan: Lipoprotein A (LPA), Comprehensive metabolic panel, CBC, VAS [place] AAA DUPLEX  PLAN:    In order of problems listed above:  1.  Coronary artery calcification: Continue aspirin, statin, and strict blood pressure control. 2.  Aortic atherosclerosis: Aspirin, atorvastatin with goal LDL less than 70, and strict blood pressure control. 3.  COPD: Continue current therapy.  Given his prior heavy smoking history over 40 years, will obtain abdominal ultrasound to screen for AAA. 4.  Hyperlipidemia: LDL is at goal.  He has had 2 LP(a) levels 1 which was normal and 1 which is elevated.  We will check LP(a), CMP and CBC today.  If LP(a) is elevated will refer to Dr. Rennis Golden for further recommendations.   5.  Elevated BMI: I am happy to report he is lost close to 80 pounds alteration in diet. 6.  Hypertension: Given LVH on his  echocardiogram we will start losartan 12.5 mg at bedtime     Dispo:  Return in about 18 months (around 02/12/2024).     Medication Adjustments/Labs and Tests Ordered: Current medicines are reviewed at length with the patient today.  Concerns regarding medicines are outlined above.   Tests Ordered: Orders Placed This Encounter  Procedures   Lipoprotein A (LPA)   Comprehensive metabolic panel   CBC   VAS [place] AAA DUPLEX    Medication Changes: Meds ordered this encounter  Medications   losartan (COZAAR) 25 MG tablet    Sig: Take 0.5 tablets (12.5 mg total) by mouth at bedtime.    Dispense:  45 tablet    Refill:  3    History of Present Illness:    FOCUSED PROBLEM LIST:   1.  Coronary artery calcium and aortic atherosclerosis on CT scan February 2023 2.  COPD with remote tobacco abuse 3.  Hyperlipidemia    March 2023: The patient is a 66 y.o. male with the indicated medical history here for recommendations regarding elevated calcium score and atherosclerosis of the aorta seen on calcium score CT scan.  This was ordered due to coronary calcifications previously noted on other imaging.  When he discussed this with his PCP he reported no exertional chest pain or dyspnea.   He works full-time.  He has had no issues or any cardiovascular complaints.  He tells me his blood pressure is usually pretty well controlled at home or at his providers offices.  He is required no hospitalizations or emergency room visits.  He fortunately continues  to abstain from smoking.  He occasionally wheezes but his inhalers typically controlled this for the most part.  He has had no severe bleeding or bruising, signs or symptoms stroke, snoring, paroxysmal nocturnal dyspnea, orthopnea.  He is otherwise well and without significant complaints.  Plan: Start aspirin 81 mg, increase atorvastatin to 40 mg, consider pharmacy referral for elevated BMI, check lipid panel and LP(a) in 2 months; obtain  echocardiogram.  Today: In the interim the patient's LDL was found to be 50.  His LP(a) was mildly elevated at 77.  Echocardiogram demonstrated preserved LV function with no significant valvular abnormalities and mild left ventricular hypertrophy.  Through cutting out processed foods and sweets and eating lean meats and vegetables the patient has lost 80 pounds.  He feels very well.  He has required no emergency room visits or hospitalizations.  He has no cardiovascular complaints today.       Current Medications: Current Meds  Medication Sig   albuterol (VENTOLIN HFA) 108 (90 Base) MCG/ACT inhaler INHALE 1 TO 2 PUFFS INTO THE LUNGS EVERY 6 HOURS AS NEEDED   aspirin EC 81 MG tablet Take 1 tablet (81 mg total) by mouth daily. Swallow whole.   atorvastatin (LIPITOR) 40 MG tablet Take 1 tablet (40 mg total) by mouth daily.   Cholecalciferol (VITAMIN D3) 5000 units CAPS Take by mouth daily.   losartan (COZAAR) 25 MG tablet Take 0.5 tablets (12.5 mg total) by mouth at bedtime.   [DISCONTINUED] fluticasone-salmeterol (ADVAIR DISKUS) 250-50 MCG/ACT AEPB Inhale 1 puff into the lungs in the morning and at bedtime. Rinse after use     Allergies:    Patient has no known allergies.   Social History:   Social History   Tobacco Use   Smoking status: Former    Packs/day: 1.00    Years: 40.00    Total pack years: 40.00    Types: Cigarettes    Quit date: 11/24/2010    Years since quitting: 11.7   Smokeless tobacco: Never  Substance Use Topics   Alcohol use: Yes    Comment: Six pack per week   Drug use: No     Family Hx: Family History  Problem Relation Age of Onset   COPD Mother    Arthritis Mother        rheumatoid arthritis   Lupus Mother    Leukemia Father    Colon cancer Neg Hx    Prostate cancer Neg Hx      Review of Systems:   Please see the history of present illness.    All other systems reviewed and are negative.     EKGs/Labs/Other Test Reviewed:    EKG: 2017, sinus  rhythm; EKG today sinus rhythm with nonspecific ST and T wave changes in lead III and aVF  Prior CV studies:  TTE 2023  1. Left ventricular ejection fraction, by estimation, is 60 to 65%. The  left ventricle has normal function. The left ventricle has no regional  wall motion abnormalities. There is mild left ventricular hypertrophy.  Left ventricular diastolic parameters  were normal.   2. Right ventricular systolic function is normal. The right ventricular  size is normal.   3. The mitral valve is normal in structure. Trivial mitral valve  regurgitation. No evidence of mitral stenosis.   4. The aortic valve is tricuspid. Aortic valve regurgitation is not  visualized. No aortic stenosis is present.   5. Aortic dilatation noted. There is borderline dilatation of the  ascending  aorta, measuring 38 mm.   6. The inferior vena cava is normal in size with greater than 50%  respiratory variability, suggesting right atrial pressure of 3 mmHg.   Calcium score 2023 Left main: 0 Left anterior descending artery: 300 Left circumflex artery: 65  Right coronary artery: 28 Total: 393  Percentile: 83rd  IMPRESSION: 1. Coronary calcium score of 393. This was 83rd percentile for age-, race-, and sex-matched controls. 2. Aortic atherosclerosis.  Imaging studies that I have independently reviewed today: CT  Recent Labs: 08/23/2021: Hemoglobin 15.1; Platelets 207.0; TSH 1.55 03/20/2022: BUN 12; Creatinine, Ser 0.74; Potassium 4.6; Sodium 140 04/11/2022: ALT 44   Recent Lipid Panel Lab Results  Component Value Date/Time   CHOL 96 (L) 04/11/2022 07:23 AM   TRIG 115 04/11/2022 07:23 AM   HDL 25 (L) 04/11/2022 07:23 AM   LDLCALC 50 04/11/2022 07:23 AM    Risk Assessment/Calculations:          Physical Exam:    VS:  BP 128/74   Pulse 89   Ht 5\' 11"  (1.803 m)   Wt 209 lb 6.4 oz (95 kg)   SpO2 100%   BMI 29.21 kg/m    Wt Readings from Last 3 Encounters:  08/14/22 209 lb 6.4 oz (95  kg)  03/20/22 289 lb (131.1 kg)  02/03/22 289 lb (131.1 kg)    GENERAL:  No apparent distress, AOx3 HEENT:  No carotid bruits, +2 carotid impulses, no scleral icterus CAR: RRR no murmurs, gallops, rubs, or thrills RES:  Clear to auscultation bilaterally ABD:  Soft, nontender, nondistended, positive bowel sounds x 4 VASC:  +2 radial pulses, +2 carotid pulses, palpable pedal pulses NEURO:  CN 2-12 grossly intact; motor and sensory grossly intact PSYCH:  No active depression or anxiety EXT:  No edema, ecchymosis, or cyanosis  Signed, Early Osmond, MD  08/14/2022 9:46 AM    [address], [place], [place]  [address] Phone: [phone]; Fax: [phone]   Note:  This document was prepared using Dragon voice recognition software and may include unintentional dictation errors.

## 2022-08-14 ENCOUNTER — Encounter: Payer: Self-pay | Admitting: Internal Medicine

## 2022-08-14 ENCOUNTER — Ambulatory Visit: Payer: BC Managed Care – PPO | Attending: Internal Medicine | Admitting: Internal Medicine

## 2022-08-14 VITALS — BP 128/74 | HR 89 | Ht 71.0 in | Wt 209.4 lb

## 2022-08-14 DIAGNOSIS — Z6841 Body Mass Index (BMI) 40.0 and over, adult: Secondary | ICD-10-CM | POA: Diagnosis not present

## 2022-08-14 DIAGNOSIS — E785 Hyperlipidemia, unspecified: Secondary | ICD-10-CM | POA: Diagnosis not present

## 2022-08-14 DIAGNOSIS — I2510 Atherosclerotic heart disease of native coronary artery without angina pectoris: Secondary | ICD-10-CM

## 2022-08-14 DIAGNOSIS — I700 Atherosclerosis of aorta: Secondary | ICD-10-CM

## 2022-08-14 DIAGNOSIS — E7841 Elevated Lipoprotein(a): Secondary | ICD-10-CM

## 2022-08-14 DIAGNOSIS — J449 Chronic obstructive pulmonary disease, unspecified: Secondary | ICD-10-CM

## 2022-08-14 DIAGNOSIS — I10 Essential (primary) hypertension: Secondary | ICD-10-CM | POA: Diagnosis not present

## 2022-08-14 MED ORDER — LOSARTAN POTASSIUM 25 MG PO TABS: 12.5000 mg | ORAL_TABLET | Freq: Every day | ORAL | 3 refills | Status: DC

## 2022-08-14 NOTE — Patient Instructions (Signed)
Medication Instructions:  Your physician has recommended you make the following change in your medication:  1.) start losartan 25 mg tablets --TAKE HALF TABLET (12.5 mg) DAILY AT BEDTIME  *If you need a refill on your cardiac medications before your next appointment, please call your pharmacy*   Lab Work: Today: cmet, cbc, Lp(a)  If you have labs (blood work) drawn today and your tests are completely normal, you will receive your results only by: [place] (if you have MyChart) OR A paper copy in the mail If you have any lab test that is abnormal or we need to change your treatment, we will call you to review the results.   Testing/Procedures: Your physician has requested that you have an abdominal aorta duplex. During this test, an ultrasound is used to evaluate the aorta. Allow 30 minutes for this exam. Do not eat after midnight the day before and avoid carbonated beverages   Follow-Up: At [facility], you and your health needs are our priority.  As part of our continuing mission to provide you with exceptional heart care, we have created designated Provider Care Teams.  These Care Teams include your primary Cardiologist (physician) and Advanced Practice Providers (APPs -  Physician Assistants and Nurse Practitioners) who all work together to provide you with the care you need, when you need it.   Your next appointment:   18 month(s)  The format for your next appointment:   In Person  Provider:   Early Osmond, MD     Other Instructions   Important Information About Sugar

## 2022-08-15 LAB — COMPREHENSIVE METABOLIC PANEL
ALT: 14 IU/L (ref 0–44)
AST: 15 IU/L (ref 0–40)
Albumin/Globulin Ratio: 1.8 (ref 1.2–2.2)
Albumin: 4.8 g/dL (ref 3.9–4.9)
Alkaline Phosphatase: 68 IU/L (ref 44–121)
BUN/Creatinine Ratio: 12 (ref 10–24)
BUN: 8 mg/dL (ref 8–27)
Bilirubin Total: 0.4 mg/dL (ref 0.0–1.2)
CO2: 26 mmol/L (ref 20–29)
Calcium: 9.9 mg/dL (ref 8.6–10.2)
Chloride: 99 mmol/L (ref 96–106)
Creatinine, Ser: 0.66 mg/dL — ABNORMAL LOW (ref 0.76–1.27)
Globulin, Total: 2.7 g/dL (ref 1.5–4.5)
Glucose: 88 mg/dL (ref 70–99)
Potassium: 3.9 mmol/L (ref 3.5–5.2)
Sodium: 142 mmol/L (ref 134–144)
Total Protein: 7.5 g/dL (ref 6.0–8.5)
eGFR: 105 mL/min/{1.73_m2} (ref >59)

## 2022-08-15 LAB — CBC
Hematocrit: 45.2 % (ref 37.5–51.0)
Hemoglobin: 15.1 g/dL (ref 13.0–17.7)
MCH: 30.8 pg (ref 26.6–33.0)
MCHC: 33.4 g/dL (ref 31.5–35.7)
MCV: 92 fL (ref 79–97)
Platelets: 225 10*3/uL (ref 150–450)
RBC: 4.9 x10E6/uL (ref 4.14–5.80)
RDW: 14.8 % (ref 11.6–15.4)
WBC: 6.4 10*3/uL (ref 3.4–10.8)

## 2022-08-15 LAB — LIPOPROTEIN A (LPA): Lipoprotein (a): 66.4 nmol/L (ref <75.0)

## 2022-09-02 ENCOUNTER — Other Ambulatory Visit: Payer: Self-pay | Admitting: Family Medicine

## 2022-10-15 ENCOUNTER — Ambulatory Visit (HOSPITAL_COMMUNITY): Admission: RE | Admit: 2022-10-15 | Payer: BC Managed Care – PPO | Source: Ambulatory Visit

## 2022-11-12 ENCOUNTER — Other Ambulatory Visit: Payer: BC Managed Care – PPO

## 2022-11-12 ENCOUNTER — Ambulatory Visit
Admission: RE | Admit: 2022-11-12 | Discharge: 2022-11-12 | Disposition: A | Discharge status: Home / Self Care | Payer: BC Managed Care – PPO | Source: Ambulatory Visit | Attending: Acute Care | Admitting: Acute Care

## 2022-11-12 DIAGNOSIS — I2510 Atherosclerotic heart disease of native coronary artery without angina pectoris: Secondary | ICD-10-CM | POA: Diagnosis not present

## 2022-11-12 DIAGNOSIS — I700 Atherosclerosis of aorta: Secondary | ICD-10-CM | POA: Diagnosis not present

## 2022-11-12 DIAGNOSIS — R918 Other nonspecific abnormal finding of lung field: Secondary | ICD-10-CM | POA: Diagnosis not present

## 2022-11-12 DIAGNOSIS — Z87891 Personal history of nicotine dependence: Secondary | ICD-10-CM

## 2022-11-18 ENCOUNTER — Other Ambulatory Visit: Payer: Self-pay | Admitting: Acute Care

## 2022-11-18 DIAGNOSIS — Z87891 Personal history of nicotine dependence: Secondary | ICD-10-CM

## 2022-11-18 DIAGNOSIS — Z122 Encounter for screening for malignant neoplasm of respiratory organs: Secondary | ICD-10-CM

## 2022-12-12 ENCOUNTER — Telehealth: Payer: Self-pay | Admitting: Family Medicine

## 2022-12-12 NOTE — Telephone Encounter (Signed)
Patient called and ask why the medication advair was off his list and requested a call back. Call back number [phone].

## 2022-12-12 NOTE — Telephone Encounter (Signed)
After chart review looks like Early Osmond, MD discontinued advair on 08/14/2022 08:41am. I called patient and advised him that cardiology discontinued that medication and I am not sure why. He would like to have this rx refilled if possible?

## 2022-12-13 MED ORDER — FLUTICASONE-SALMETEROL 250-50 MCG/ACT IN AEPB: 1.0000 | INHALATION_SPRAY | Freq: Two times a day (BID) | RESPIRATORY_TRACT | 12 refills | Status: DC

## 2022-12-13 NOTE — Addendum Note (Signed)
Addended by: Tonia Ghent on: 12/13/2022 06:07 PM   Modules accepted: Orders

## 2022-12-13 NOTE — Telephone Encounter (Signed)
I resent the Rx.  Please let me know if he has trouble getting it filled.  Thanks.

## 2022-12-15 NOTE — Telephone Encounter (Signed)
Called and notified patient. He will notify us if having trouble getting it.

## 2022-12-18 ENCOUNTER — Encounter: Payer: Self-pay | Admitting: Family Medicine

## 2022-12-18 ENCOUNTER — Ambulatory Visit (INDEPENDENT_AMBULATORY_CARE_PROVIDER_SITE_OTHER)
Admission: RE | Admit: 2022-12-18 | Discharge: 2022-12-18 | Disposition: A | Discharge status: Home / Self Care | Payer: BC Managed Care – PPO | Source: Ambulatory Visit | Attending: Family Medicine | Admitting: Family Medicine

## 2022-12-18 ENCOUNTER — Ambulatory Visit: Payer: BC Managed Care – PPO | Admitting: Family Medicine

## 2022-12-18 VITALS — BP 110/60 | HR 64 | Temp 98.0°F | Ht 71.0 in | Wt 178.0 lb

## 2022-12-18 DIAGNOSIS — R21 Rash and other nonspecific skin eruption: Secondary | ICD-10-CM | POA: Diagnosis not present

## 2022-12-18 DIAGNOSIS — M19011 Primary osteoarthritis, right shoulder: Secondary | ICD-10-CM | POA: Diagnosis not present

## 2022-12-18 DIAGNOSIS — I2510 Atherosclerotic heart disease of native coronary artery without angina pectoris: Secondary | ICD-10-CM

## 2022-12-18 DIAGNOSIS — M898X1 Other specified disorders of bone, shoulder: Secondary | ICD-10-CM

## 2022-12-18 NOTE — Progress Notes (Signed)
History of coronary disease.  Sig weight loss with cutting out all fast food.  Inc in [place].  Baked chicken.  No fried foods.  He cut back on diary.  He cut out bacon.  No FCNAVD. No abd pain.  No blood in stool.  He feels good.  No cough.   Weight loss over the last year or so.    He couldn't tolerate losartan due to lower BP.  Discussed with patient about rationale for use but given his blood pressure it would not make sense to try restarting it.  Skin changes on the back x 1 week. Patient has no idea about how it got there. Uses a heating pad.  Doesn't itch.  No other skin changes.  Doesn't use the heating pad elsewhere.  No nosebleeds, no gum bleeding.    R  joint is sore and more prominent than the L.  Noted for about a month.   Meds, vitals, and allergies reviewed.   ROS: Per HPI unless specifically indicated in ROS section   GEN: nad, alert and oriented HEENT: ncat NECK: supple w/o LA CV: rrr.  PULM: ctab, no inc wob ABD: soft, +bs EXT: no edema SKIN: No rash other than what looks like erythema ab igne on the back.   Medial portion of the right clavicle is prominent compared to the medial portion of the left.  No overlying erythema.

## 2022-12-18 NOTE — Patient Instructions (Addendum)
Cut back on the heating pad.  Go to the lab on the way out.   If you have mychart we'll likely use that to update you.    Take care.  Glad to see you.

## 2022-12-19 ENCOUNTER — Ambulatory Visit (HOSPITAL_COMMUNITY)
Admission: RE | Admit: 2022-12-19 | Discharge: 2022-12-19 | Disposition: A | Discharge status: Home / Self Care | Payer: BC Managed Care – PPO | Source: Ambulatory Visit | Attending: Cardiovascular Disease | Admitting: Cardiovascular Disease

## 2022-12-19 DIAGNOSIS — I2510 Atherosclerotic heart disease of native coronary artery without angina pectoris: Secondary | ICD-10-CM | POA: Diagnosis not present

## 2022-12-19 DIAGNOSIS — E7841 Elevated Lipoprotein(a): Secondary | ICD-10-CM | POA: Diagnosis not present

## 2022-12-19 DIAGNOSIS — Z6841 Body Mass Index (BMI) 40.0 and over, adult: Secondary | ICD-10-CM | POA: Insufficient documentation

## 2022-12-19 DIAGNOSIS — I700 Atherosclerosis of aorta: Secondary | ICD-10-CM | POA: Insufficient documentation

## 2022-12-19 DIAGNOSIS — I10 Essential (primary) hypertension: Secondary | ICD-10-CM | POA: Diagnosis not present

## 2022-12-19 DIAGNOSIS — J449 Chronic obstructive pulmonary disease, unspecified: Secondary | ICD-10-CM | POA: Diagnosis not present

## 2022-12-19 DIAGNOSIS — E785 Hyperlipidemia, unspecified: Secondary | ICD-10-CM | POA: Diagnosis not present

## 2022-12-19 LAB — CBC WITH DIFFERENTIAL/PLATELET
Basophils Absolute: 0.1 10*3/uL (ref 0.0–0.1)
Basophils Relative: 1.1 % (ref 0.0–3.0)
Eosinophils Absolute: 0.2 10*3/uL (ref 0.0–0.7)
Eosinophils Relative: 3.6 % (ref 0.0–5.0)
HCT: 40.6 % (ref 39.0–52.0)
Hemoglobin: 13.9 g/dL (ref 13.0–17.0)
Lymphocytes Relative: 36.2 % (ref 12.0–46.0)
Lymphs Abs: 2.2 10*3/uL (ref 0.7–4.0)
MCHC: 34.2 g/dL (ref 30.0–36.0)
MCV: 93.2 fl (ref 78.0–100.0)
Monocytes Absolute: 0.5 10*3/uL (ref 0.1–1.0)
Monocytes Relative: 8.1 % (ref 3.0–12.0)
Neutro Abs: 3.1 10*3/uL (ref 1.4–7.7)
Neutrophils Relative %: 51 % (ref 43.0–77.0)
Platelets: 213 10*3/uL (ref 150.0–400.0)
RBC: 4.36 Mil/uL (ref 4.22–5.81)
RDW: 14.8 % (ref 11.5–15.5)
WBC: 6.1 10*3/uL (ref 4.0–10.5)

## 2022-12-19 LAB — LIPID PANEL
Cholesterol: 113 mg/dL (ref 0–200)
HDL: 40.2 mg/dL (ref >39.00)
LDL Cholesterol: 63 mg/dL (ref 0–99)
NonHDL: 72.8
Total CHOL/HDL Ratio: 3
Triglycerides: 51 mg/dL (ref 0.0–149.0)
VLDL: 10.2 mg/dL (ref 0.0–40.0)

## 2022-12-19 LAB — COMPREHENSIVE METABOLIC PANEL
ALT: 14 U/L (ref 0–53)
AST: 14 U/L (ref 0–37)
Albumin: 4.7 g/dL (ref 3.5–5.2)
Alkaline Phosphatase: 48 U/L (ref 39–117)
BUN: 13 mg/dL (ref 6–23)
CO2: 32 mEq/L (ref 19–32)
Calcium: 10.1 mg/dL (ref 8.4–10.5)
Chloride: 97 mEq/L (ref 96–112)
Creatinine, Ser: 0.73 mg/dL (ref 0.40–1.50)
GFR: 95.95 mL/min (ref >60.00)
Glucose, Bld: 86 mg/dL (ref 70–99)
Potassium: 4.7 mEq/L (ref 3.5–5.1)
Sodium: 137 mEq/L (ref 135–145)
Total Bilirubin: 0.6 mg/dL (ref 0.2–1.2)
Total Protein: 7.4 g/dL (ref 6.0–8.3)

## 2022-12-19 LAB — TSH: TSH: 2.18 u[IU]/mL (ref 0.35–5.50)

## 2022-12-21 DIAGNOSIS — R21 Rash and other nonspecific skin eruption: Secondary | ICD-10-CM | POA: Insufficient documentation

## 2022-12-21 DIAGNOSIS — M898X1 Other specified disorders of bone, shoulder: Secondary | ICD-10-CM | POA: Insufficient documentation

## 2022-12-21 NOTE — Assessment & Plan Note (Signed)
History of significant weight loss, intentional.  See notes on labs.

## 2022-12-21 NOTE — Assessment & Plan Note (Signed)
Looks like erythema ab igne.  Would stop using heating pad in the meantime.  See notes on labs.  He agrees with plan.

## 2022-12-21 NOTE — Assessment & Plan Note (Signed)
This could be his baseline but it is just more noticeable/prominent with weight reduction.  See notes on plain films.

## 2023-06-23 ENCOUNTER — Other Ambulatory Visit: Payer: Self-pay | Admitting: Family Medicine

## 2023-06-23 DIAGNOSIS — I2510 Atherosclerotic heart disease of native coronary artery without angina pectoris: Secondary | ICD-10-CM

## 2023-06-25 ENCOUNTER — Other Ambulatory Visit (INDEPENDENT_AMBULATORY_CARE_PROVIDER_SITE_OTHER): Payer: BC Managed Care – PPO

## 2023-06-25 DIAGNOSIS — I2510 Atherosclerotic heart disease of native coronary artery without angina pectoris: Secondary | ICD-10-CM | POA: Diagnosis not present

## 2023-06-25 LAB — COMPREHENSIVE METABOLIC PANEL
ALT: 15 U/L (ref 0–53)
AST: 12 U/L (ref 0–37)
Albumin: 4.5 g/dL (ref 3.5–5.2)
Alkaline Phosphatase: 39 U/L (ref 39–117)
BUN: 17 mg/dL (ref 6–23)
CO2: 32 mEq/L (ref 19–32)
Calcium: 9.9 mg/dL (ref 8.4–10.5)
Chloride: 98 mEq/L (ref 96–112)
Creatinine, Ser: 0.72 mg/dL (ref 0.40–1.50)
GFR: 96 mL/min (ref >60.00)
Glucose, Bld: 89 mg/dL (ref 70–99)
Potassium: 4.1 mEq/L (ref 3.5–5.1)
Sodium: 137 mEq/L (ref 135–145)
Total Bilirubin: 0.6 mg/dL (ref 0.2–1.2)
Total Protein: 7.3 g/dL (ref 6.0–8.3)

## 2023-06-25 LAB — LIPID PANEL
Cholesterol: 127 mg/dL (ref 0–200)
HDL: 45.7 mg/dL (ref >39.00)
LDL Cholesterol: 71 mg/dL (ref 0–99)
NonHDL: 81.47
Total CHOL/HDL Ratio: 3
Triglycerides: 51 mg/dL (ref 0.0–149.0)
VLDL: 10.2 mg/dL (ref 0.0–40.0)

## 2023-07-03 ENCOUNTER — Encounter: Payer: Self-pay | Admitting: Family Medicine

## 2023-07-03 ENCOUNTER — Ambulatory Visit (INDEPENDENT_AMBULATORY_CARE_PROVIDER_SITE_OTHER): Payer: BC Managed Care – PPO | Admitting: Family Medicine

## 2023-07-03 VITALS — BP 120/60 | HR 64 | Temp 97.6°F | Ht 70.5 in | Wt 145.0 lb

## 2023-07-03 DIAGNOSIS — Z0000 Encounter for general adult medical examination without abnormal findings: Secondary | ICD-10-CM

## 2023-07-03 DIAGNOSIS — I2510 Atherosclerotic heart disease of native coronary artery without angina pectoris: Secondary | ICD-10-CM

## 2023-07-03 DIAGNOSIS — Z7189 Other specified counseling: Secondary | ICD-10-CM

## 2023-07-03 DIAGNOSIS — J449 Chronic obstructive pulmonary disease, unspecified: Secondary | ICD-10-CM

## 2023-07-03 MED ORDER — PRAVASTATIN SODIUM 10 MG PO TABS: 10.0000 mg | ORAL_TABLET | Freq: Every day | ORAL | 3 refills | Status: DC

## 2023-07-03 NOTE — Progress Notes (Unsigned)
CPE- See plan.  Routine anticipatory guidance given to patient.  See health maintenance.  The possibility exists that previously documented standard health maintenance information may have been brought forward from a previous encounter into this note.  If needed, that same information has been updated to reflect the current situation based on today's encounter.    Tetanus 2018 PNA out of stock.   Shingles d/w pt.   Flu to be done this fall.   covid vaccine 2021.  Colonoscopy 2015.  he'll call about follow up vs update me if referral needed.   Prostate cancer screening and PSA options (with potential risks and benefits of testing vs not testing) were discussed along with recent recs/guidelines.  He declined testing PSA at this point. Diet and exercise discussed with patient.  HIV screening previously done per patient report. HCV screening prev done per patient report.   Living will discussed with patient. If patient were incapacitated then would have his wife designated.  He had neg AAA screening He has yearly lung cancer screening CT.   See after visit summary.  CAD d/w pt.   Weight is stable now- h/o weight loss noted.  D/w pt about statin use.  He didn't tolerate atorvastatin, due to aches.    No inhaler use in the last few months.  He prev had relief with inhaler use while he was smoking.  No CP, not SOB. Walking several miles a day.    PMH and SH reviewed  Meds, vitals, and allergies reviewed.   ROS: Per HPI.  Unless specifically indicated otherwise in HPI, the patient denies:  General: fever. Eyes: acute vision changes ENT: sore throat Cardiovascular: chest pain Respiratory: SOB GI: vomiting GU: dysuria Musculoskeletal: acute back pain Derm: acute rash Neuro: acute motor dysfunction Psych: worsening mood Endocrine: polydipsia Heme: bleeding Allergy: hayfever  GEN: nad, alert and oriented HEENT: ncat NECK: supple w/o LA CV: rrr. PULM: ctab, no inc wob ABD: soft,  +bs EXT: no edema SKIN: no acute rash

## 2023-07-03 NOTE — Patient Instructions (Addendum)
Let me know if you need a referral for a colonoscopy.    I would get a flu shot each fall.   Pneumonia 20 vaccine when possible.  Check with your insurance to see if they will cover the shingles shot.  If you have cough/wheeze or need your inhaler, then let me know so we can consider PFTs.    See if you can tolerate pravastatin 10mg  at night.  If you can't, then let me know.   Take care.  Glad to see you.

## 2023-07-05 NOTE — Assessment & Plan Note (Signed)
Tetanus 2018 PNA out of stock.   Shingles d/w pt.   Flu to be done this fall.   covid vaccine 2021.  Colonoscopy 2015.  he'll call about follow up vs update me if referral needed.   Prostate cancer screening and PSA options (with potential risks and benefits of testing vs not testing) were discussed along with recent recs/guidelines.  He declined testing PSA at this point. Diet and exercise discussed with patient.  HIV screening previously done per patient report. HCV screening prev done per patient report.   Living will discussed with patient. If patient were incapacitated then would have his wife designated.  He had neg AAA screening He has yearly lung cancer screening CT.

## 2023-07-05 NOTE — Assessment & Plan Note (Signed)
Presumed history but he has not had confirmatory PFTs.  Smoking history noted.  Discussed with patient.  Most accurately described as possible COPD at this point since we do not have definitive diagnosis.  It does not make sense to send him for pulmonary function testing at this point since he is doing well.  No inhaler use in the last few months.  He prev had relief with inhaler use while he was smoking.  He has stopped smoking.  No CP, not SOB. Walking several miles a day.    Plan going forward- If having cough/wheeze or needing his inhaler, then let me know so we can consider PFTs.

## 2023-07-05 NOTE — Assessment & Plan Note (Signed)
CAD d/w pt.   Weight is stable now- h/o weight loss noted.  D/w pt about statin use.  He didn't tolerate atorvastatin, due to aches.   He will see if he can tolerate pravastatin 10mg  at night.  If he can't, then let me know.  Rationale for statin use discussed with patient.

## 2023-07-05 NOTE — Assessment & Plan Note (Signed)
Living will discussed with patient. If patient were incapacitated then would have his wife designated. ?

## 2023-10-19 ENCOUNTER — Other Ambulatory Visit: Payer: Self-pay | Admitting: Acute Care

## 2023-10-19 DIAGNOSIS — Z87891 Personal history of nicotine dependence: Secondary | ICD-10-CM

## 2023-10-19 DIAGNOSIS — Z122 Encounter for screening for malignant neoplasm of respiratory organs: Secondary | ICD-10-CM

## 2023-11-19 ENCOUNTER — Other Ambulatory Visit: Payer: BC Managed Care – PPO

## 2023-11-26 ENCOUNTER — Ambulatory Visit
Admission: RE | Admit: 2023-11-26 | Discharge: 2023-11-26 | Disposition: A | Discharge status: Home / Self Care | Payer: BLUE CROSS/BLUE SHIELD | Source: Ambulatory Visit | Attending: Acute Care | Admitting: Acute Care

## 2023-11-26 DIAGNOSIS — Z122 Encounter for screening for malignant neoplasm of respiratory organs: Secondary | ICD-10-CM

## 2023-11-26 DIAGNOSIS — Z87891 Personal history of nicotine dependence: Secondary | ICD-10-CM

## 2023-12-08 ENCOUNTER — Other Ambulatory Visit: Payer: Self-pay | Admitting: Acute Care

## 2023-12-08 DIAGNOSIS — Z122 Encounter for screening for malignant neoplasm of respiratory organs: Secondary | ICD-10-CM

## 2023-12-08 DIAGNOSIS — Z87891 Personal history of nicotine dependence: Secondary | ICD-10-CM

## 2024-03-02 NOTE — Progress Notes (Unsigned)
 Cardiology Office Note:   Date:  03/02/2024  ID:  Juan Knight, DOB 1956-07-26, MRN [id number] PCP:  Juan Nam, MD  [facility] Providers Cardiologist:  Juan Skeans, MD Referring MD: Juan Nam, MD  Chief Complaint/Reason for Referral: Follow-up coronary artery calcification ASSESSMENT:    1. Coronary artery calcification seen on CAT scan   2. Hyperlipidemia LDL goal <70   3. Aortic atherosclerosis (HCC)   4. Primary hypertension   5. BMI 29.0-29.9,adult     PLAN:   In order of problems listed above: Coronary artery calcification: Continue aspirin 81 mg, pravastatin 10 mg. Hyperlipidemia: Continue pravastatin 10 mg; check lipid panel and LFTs today.*** Aortic atherosclerosis: Continue aspirin 81 mg, pravastatin 10 mg, strict blood pressure control. Hypertension: Mild LVH would benefit from an ARB.*** Elevated BMI: Diet and exercise modification.        {Are you ordering a CV Procedure (e.g. stress test, cath, DCCV, TEE, etc)?   Press F2        :[id number]}   Dispo:  No follow-ups on file.      Medication Adjustments/Labs and Tests Ordered: Current medicines are reviewed at length with the patient today.  Concerns regarding medicines are outlined above.  The following changes have been made:  {PLAN; NO CHANGE:13088:s}   Labs/tests ordered: No orders of the defined types were placed in this encounter.   Medication Changes: No orders of the defined types were placed in this encounter.   Current medicines are reviewed at length with the patient today.  The patient {ACTIONS; HAS/DOES NOT HAVE:19233} concerns regarding medicines.  I spent *** minutes reviewing all clinical data during and prior to this visit including all relevant imaging studies, laboratories, clinical information from other health systems and prior notes from both Cardiology and other specialties, interviewing the patient, conducting a complete physical examination, and coordinating care  in order to formulate a comprehensive and personalized evaluation and treatment plan.   History of Present Illness:      FOCUSED PROBLEM LIST:   Coronary artery calcification Chest CT 2023 Hyperlipidemia LP(a) 66.4 Aortic atherosclerosis Chest CT 2023 Hypertension Mild LVH, no significant valve issues, EF 60 to 65% TTE 2023 COPD BMI 19 February 2022: The patient is a 68 y.o. male with the indicated medical history here for recommendations regarding elevated calcium score and atherosclerosis of the aorta seen on calcium score CT scan.  This was ordered due to coronary calcifications previously noted on other imaging.  When he discussed this with his PCP he reported no exertional chest pain or dyspnea.    He works full-time.  He has had no issues or any cardiovascular complaints.  He tells me his blood pressure is usually pretty well controlled at home or at his providers offices.  He is required no hospitalizations or emergency room visits.  He fortunately continues to abstain from smoking.  He occasionally wheezes but his inhalers typically controlled this for the most part.  He has had no severe bleeding or bruising, signs or symptoms stroke, snoring, paroxysmal nocturnal dyspnea, orthopnea.  He is otherwise well and without significant complaints.  Plan: Start aspirin 81 mg, increase atorvastatin to 40 mg, consider pharmacy referral for elevated BMI, check lipid panel and LP(a) in 2 months; obtain echocardiogram.   September 2023: In the interim the patient's LDL was found to be 50.  His LP(a) was mildly elevated at 77.  Echocardiogram demonstrated preserved LV function with no significant valvular abnormalities and mild left  ventricular hypertrophy.  Through cutting out processed foods and sweets and eating lean meats and vegetables the patient has lost 80 pounds.  He feels very well.  He has required no emergency room visits or hospitalizations.  He has no cardiovascular complaints today plan:  Obtain abdominal ultrasound to screen for AAA; check LP(a), start losartan 12.5 mg.  April 2025:  Patient consents to use of AI scribe.*** In the interim his LP(a) was not elevated.  His abdominal ultrasound showed no aortic aneurysms.  His LDL most recently was around 70 in August 2024.  When he was seen at that time by his PCP he was doing well.  His blood pressure is well-controlled.        Current Medications: No outpatient medications have been marked as taking for the 03/04/24 encounter (Appointment) with Juan Pyo, MD.     Review of Systems:   Please see the history of present illness.    All other systems reviewed and are negative.     EKGs/Labs/Other Test Reviewed:   EKG: EKG from 2023 demonstrates sinus rhythm nonspecific ST and T wave changes  EKG Interpretation Date/Time:    Ventricular Rate:    PR Interval:    QRS Duration:    QT Interval:    QTC Calculation:   R Axis:      Text Interpretation:           Risk Assessment/Calculations:   {Does this patient have ATRIAL FIBRILLATION?:[id number]}      Physical Exam:   VS:  There were no vitals taken for this visit.   No BP recorded.  {Refresh Note OR Click here to enter BP  :1}***   Wt Readings from Last 3 Encounters:  07/03/23 145 lb (65.8 kg)  12/18/22 178 lb (80.7 kg)  08/14/22 209 lb 6.4 oz (95 kg)      GENERAL:  No apparent distress, AOx3 HEENT:  No carotid bruits, +2 carotid impulses, no scleral icterus CAR: RRR Irregular RR*** no murmurs***, gallops, rubs, or thrills RES:  Clear to auscultation bilaterally ABD:  Soft, nontender, nondistended, positive bowel sounds x 4 VASC:  +2 radial pulses, +2 carotid pulses NEURO:  CN 2-12 grossly intact; motor and sensory grossly intact PSYCH:  No active depression or anxiety EXT:  No edema, ecchymosis, or cyanosis  Signed, Juan Pyo, MD  03/02/2024 3:52 PM    [facility] [address] [place], [place], [place]  [address] Phone:  [phone]; Fax: [phone]   Note:  This document was prepared using Dragon voice recognition software and may include unintentional dictation errors.

## 2024-03-04 ENCOUNTER — Encounter: Payer: Self-pay | Admitting: Internal Medicine

## 2024-03-04 ENCOUNTER — Ambulatory Visit: Attending: Internal Medicine | Admitting: Internal Medicine

## 2024-03-04 VITALS — BP 113/62 | HR 53 | Resp 16 | Ht 70.0 in | Wt 145.0 lb

## 2024-03-04 DIAGNOSIS — I2510 Atherosclerotic heart disease of native coronary artery without angina pectoris: Secondary | ICD-10-CM

## 2024-03-04 DIAGNOSIS — I700 Atherosclerosis of aorta: Secondary | ICD-10-CM

## 2024-03-04 DIAGNOSIS — E785 Hyperlipidemia, unspecified: Secondary | ICD-10-CM | POA: Diagnosis not present

## 2024-03-04 DIAGNOSIS — I10 Essential (primary) hypertension: Secondary | ICD-10-CM | POA: Diagnosis not present

## 2024-03-04 DIAGNOSIS — Z6829 Body mass index (BMI) 29.0-29.9, adult: Secondary | ICD-10-CM

## 2024-03-04 NOTE — Patient Instructions (Signed)
 Medication Instructions:  No changes *If you need a refill on your cardiac medications before your next appointment, please call your pharmacy*  Lab Work: Go to the first floor [facility], or any [facility]: Lipids, liver function You may go to any of these [employer] locations:   [facility] - [address] ([facility]) - [address] [phone] [address]   [facility] - [address]    [place]  - [address]    [place] - [address] - [address] [employer]  - [address] [place] - [address] ([facility])  [place]   - [address], [address]  Follow-Up: At [facility], you and your health needs are our priority.  As part of our continuing mission to provide you with exceptional heart care, our providers are all part of one team.  This team includes your primary Cardiologist (physician) and Advanced Practice Providers or APPs (Physician Assistants and Nurse Practitioners) who all work together to provide you with the care you need, when you need it.  Your next appointment:   18 month(s)  Provider:   [name], MD  We recommend signing up for the patient portal called "MyChart".  Sign up information is provided on this After Visit Summary.  MyChart is used to connect with patients for Virtual Visits (Telemedicine).  Patients are able to view lab/test results, encounter notes, upcoming appointments, etc.  Non-urgent messages can be sent to your provider as well.   To learn more about what you can do with MyChart, go to [email or web address].        1st Floor: - Lobby - Registration  - Pharmacy  - Lab - Cafe  2nd Floor: - PV Lab - Diagnostic Testing (echo, CT, nuclear med)  3rd Floor: - Vacant  4th Floor: - TCTS (cardiothoracic surgery) - AFib Clinic - Structural Heart Clinic - Vascular Surgery  -  Vascular Ultrasound  5th Floor: - HeartCare Cardiology (general and EP) - Clinical Pharmacy for coumadin, hypertension, lipid, weight-loss medications, and med management appointments    Valet parking services will be available as well.

## 2024-06-21 ENCOUNTER — Ambulatory Visit: Admitting: Internal Medicine

## 2024-10-06 ENCOUNTER — Encounter: Payer: Self-pay | Admitting: Family Medicine

## 2024-10-06 ENCOUNTER — Ambulatory Visit: Payer: Self-pay | Admitting: Family Medicine

## 2024-10-06 VITALS — BP 98/62 | HR 53 | Temp 98.0°F | Ht 71.0 in | Wt 143.0 lb

## 2024-10-06 DIAGNOSIS — Z0000 Encounter for general adult medical examination without abnormal findings: Secondary | ICD-10-CM

## 2024-10-06 DIAGNOSIS — I2510 Atherosclerotic heart disease of native coronary artery without angina pectoris: Secondary | ICD-10-CM

## 2024-10-06 DIAGNOSIS — Z23 Encounter for immunization: Secondary | ICD-10-CM | POA: Diagnosis not present

## 2024-10-06 DIAGNOSIS — R0981 Nasal congestion: Secondary | ICD-10-CM

## 2024-10-06 DIAGNOSIS — Z7189 Other specified counseling: Secondary | ICD-10-CM

## 2024-10-06 LAB — COMPREHENSIVE METABOLIC PANEL WITH GFR
ALT: 20 U/L (ref 0–53)
AST: 16 U/L (ref 0–37)
Albumin: 4.7 g/dL (ref 3.5–5.2)
Alkaline Phosphatase: 50 U/L (ref 39–117)
BUN: 17 mg/dL (ref 6–23)
CO2: 31 meq/L (ref 19–32)
Calcium: 9.7 mg/dL (ref 8.4–10.5)
Chloride: 98 meq/L (ref 96–112)
Creatinine, Ser: 0.66 mg/dL (ref 0.40–1.50)
GFR: 97.67 mL/min (ref >60.00)
Glucose, Bld: 73 mg/dL (ref 70–99)
Potassium: 4.3 meq/L (ref 3.5–5.1)
Sodium: 137 meq/L (ref 135–145)
Total Bilirubin: 0.5 mg/dL (ref 0.2–1.2)
Total Protein: 7.5 g/dL (ref 6.0–8.3)

## 2024-10-06 LAB — LIPID PANEL
Cholesterol: 133 mg/dL (ref 0–200)
HDL: 50.3 mg/dL (ref >39.00)
LDL Cholesterol: 72 mg/dL (ref 0–99)
NonHDL: 82.29
Total CHOL/HDL Ratio: 3
Triglycerides: 53 mg/dL (ref 0.0–149.0)
VLDL: 10.6 mg/dL (ref 0.0–40.0)

## 2024-10-06 LAB — CBC WITH DIFFERENTIAL/PLATELET
Basophils Absolute: 0.1 K/uL (ref 0.0–0.1)
Basophils Relative: 1.5 % (ref 0.0–3.0)
Eosinophils Absolute: 0.2 K/uL (ref 0.0–0.7)
Eosinophils Relative: 4.5 % (ref 0.0–5.0)
HCT: 41.4 % (ref 39.0–52.0)
Hemoglobin: 14.1 g/dL (ref 13.0–17.0)
Lymphocytes Relative: 36.5 % (ref 12.0–46.0)
Lymphs Abs: 1.6 K/uL (ref 0.7–4.0)
MCHC: 34 g/dL (ref 30.0–36.0)
MCV: 92.9 fl (ref 78.0–100.0)
Monocytes Absolute: 0.3 K/uL (ref 0.1–1.0)
Monocytes Relative: 7.8 % (ref 3.0–12.0)
Neutro Abs: 2.2 K/uL (ref 1.4–7.7)
Neutrophils Relative %: 49.7 % (ref 43.0–77.0)
Platelets: 206 K/uL (ref 150.0–400.0)
RBC: 4.46 Mil/uL (ref 4.22–5.81)
RDW: 13.6 % (ref 11.5–15.5)
WBC: 4.4 K/uL (ref 4.0–10.5)

## 2024-10-06 LAB — TSH: TSH: 1.38 u[IU]/mL (ref 0.35–5.50)

## 2024-10-06 MED ORDER — FLUTICASONE PROPIONATE 50 MCG/ACT NA SUSP: 2.0000 | Freq: Every day | NASAL | 12 refills | Status: AC | PRN

## 2024-10-06 MED ORDER — PRAVASTATIN SODIUM 10 MG PO TABS: 5.0000 mg | ORAL_TABLET | Freq: Every day | ORAL | Status: DC

## 2024-10-06 NOTE — Patient Instructions (Addendum)
 Flu shot today.   Let me know if you need help getting an appointment with GI.  Take care.  Glad to see you. Shingles shot may be cheaper at the pharmacy.  Ask pharmacy about pneumonia 20 vaccine.  Go to the lab on the way out.   If you have mychart we'll likely use that to update you.

## 2024-10-06 NOTE — Progress Notes (Signed)
 Tetanus 2018 PNA d/w pt.    Shingles d/w pt.   Flu 2025 covid vaccine 2021.  Colonoscopy 2015.  He'll call about follow up vs update me if referral needed.   Prostate cancer screening and PSA options (with potential risks and benefits of testing vs not testing) were discussed along with recent recs/guidelines.  He declined testing PSA at this point. Diet and exercise discussed with patient.  HIV screening previously done per patient report. HCV screening prev done per patient report.   Living will discussed with patient. If patient were incapacitated then would have his wife designated.  CAD d/w pt.   Weight is stable now- h/o intentional weight loss noted.  D/w pt about statin use.  He didn't tolerate atorvastatin , due to aches.  Not lightheaded on standing.  He can tolerate pravastatin .   D/w pt about flonase  use.  It helped with congestion.    No inhaler use.  He prev had relief with inhaler use while he was smoking.  No CP, not SOB. Walking several miles a day.    Meds, vitals, and allergies reviewed.   ROS: Per HPI unless specifically indicated in ROS section   GEN: nad, alert and oriented HEENT: mucous membranes moist NECK: supple w/o LA CV: rrr PULM: ctab, no inc wob ABD: soft, +bs EXT: no edema SKIN: well perfused.

## 2024-10-09 ENCOUNTER — Ambulatory Visit: Payer: Self-pay | Admitting: Family Medicine

## 2024-10-09 DIAGNOSIS — R0981 Nasal congestion: Secondary | ICD-10-CM | POA: Insufficient documentation

## 2024-10-09 DIAGNOSIS — Z0000 Encounter for general adult medical examination without abnormal findings: Secondary | ICD-10-CM | POA: Insufficient documentation

## 2024-10-09 DIAGNOSIS — E785 Hyperlipidemia, unspecified: Secondary | ICD-10-CM

## 2024-10-09 NOTE — Assessment & Plan Note (Signed)
 He didn't tolerate atorvastatin , due to aches.  Not lightheaded on standing.  He can tolerate pravastatin .  See notes on labs.

## 2024-10-09 NOTE — Assessment & Plan Note (Signed)
 Tetanus 2018 PNA d/w pt.    Shingles d/w pt.   Flu 2025 covid vaccine 2021.  Colonoscopy 2015.  He'll call about follow up vs update me if referral needed.   Prostate cancer screening and PSA options (with potential risks and benefits of testing vs not testing) were discussed along with recent recs/guidelines.  He declined testing PSA at this point. Diet and exercise discussed with patient.  HIV screening previously done per patient report. HCV screening prev done per patient report.   Living will discussed with patient. If patient were incapacitated then would have his wife designated.

## 2024-10-09 NOTE — Assessment & Plan Note (Signed)
 Continue Flonase  as is.

## 2024-10-09 NOTE — Assessment & Plan Note (Signed)
Living will discussed with patient. If patient were incapacitated then would have his wife designated. ?

## 2024-10-17 ENCOUNTER — Encounter: Payer: Self-pay | Admitting: Radiology

## 2024-10-17 MED ORDER — PRAVASTATIN SODIUM 10 MG PO TABS: 10.0000 mg | ORAL_TABLET | Freq: Every day | ORAL | 3 refills | Status: AC

## 2024-11-22 ENCOUNTER — Encounter: Payer: Self-pay | Admitting: Acute Care

## 2024-11-30 ENCOUNTER — Ambulatory Visit
Admission: RE | Admit: 2024-11-30 | Discharge: 2024-11-30 | Disposition: A | Discharge status: Home / Self Care | Payer: Self-pay | Source: Ambulatory Visit | Attending: Acute Care | Admitting: Acute Care

## 2024-11-30 DIAGNOSIS — Z87891 Personal history of nicotine dependence: Secondary | ICD-10-CM

## 2024-11-30 DIAGNOSIS — Z122 Encounter for screening for malignant neoplasm of respiratory organs: Secondary | ICD-10-CM

## 2024-12-06 ENCOUNTER — Other Ambulatory Visit: Payer: Self-pay

## 2024-12-06 DIAGNOSIS — Z122 Encounter for screening for malignant neoplasm of respiratory organs: Secondary | ICD-10-CM

## 2024-12-06 DIAGNOSIS — Z87891 Personal history of nicotine dependence: Secondary | ICD-10-CM
